# Patient Record
Sex: Male | Born: 1965
Health system: Southern US, Community
[De-identification: ages and names within clinical notes are randomized; demographics above are authoritative.]

## PROBLEM LIST (undated history)

## (undated) HISTORY — PX: TONSILLECTOMY AND ADENOIDECTOMY: SUR1326

## (undated) HISTORY — PX: VASECTOMY: SHX75

## (undated) HISTORY — PX: CHOLECYSTECTOMY: SHX55

## (undated) HISTORY — PX: PLANTAR FASCIA SURGERY: SHX746

---

## 2007-01-21 ENCOUNTER — Ambulatory Visit: Payer: Self-pay | Admitting: Surgery

## 2012-07-11 ENCOUNTER — Ambulatory Visit: Payer: Self-pay | Admitting: Gastroenterology

## 2012-07-12 LAB — PATHOLOGY REPORT

## 2013-07-24 DIAGNOSIS — Z9109 Other allergy status, other than to drugs and biological substances: Secondary | ICD-10-CM | POA: Insufficient documentation

## 2013-07-24 DIAGNOSIS — N529 Male erectile dysfunction, unspecified: Secondary | ICD-10-CM | POA: Insufficient documentation

## 2013-07-24 DIAGNOSIS — R202 Paresthesia of skin: Secondary | ICD-10-CM | POA: Insufficient documentation

## 2013-07-24 DIAGNOSIS — Z720 Tobacco use: Secondary | ICD-10-CM | POA: Insufficient documentation

## 2013-07-24 DIAGNOSIS — R0683 Snoring: Secondary | ICD-10-CM | POA: Insufficient documentation

## 2014-05-28 ENCOUNTER — Ambulatory Visit: Payer: Self-pay | Admitting: Physician Assistant

## 2015-04-07 DIAGNOSIS — J301 Allergic rhinitis due to pollen: Secondary | ICD-10-CM | POA: Diagnosis not present

## 2015-04-07 DIAGNOSIS — F4323 Adjustment disorder with mixed anxiety and depressed mood: Secondary | ICD-10-CM | POA: Diagnosis not present

## 2015-05-18 DIAGNOSIS — F4321 Adjustment disorder with depressed mood: Secondary | ICD-10-CM | POA: Diagnosis not present

## 2015-05-18 DIAGNOSIS — Z125 Encounter for screening for malignant neoplasm of prostate: Secondary | ICD-10-CM | POA: Diagnosis not present

## 2015-05-18 DIAGNOSIS — Z79899 Other long term (current) drug therapy: Secondary | ICD-10-CM | POA: Diagnosis not present

## 2015-05-18 DIAGNOSIS — Z13220 Encounter for screening for lipoid disorders: Secondary | ICD-10-CM | POA: Diagnosis not present

## 2015-07-15 ENCOUNTER — Other Ambulatory Visit: Payer: Self-pay

## 2015-07-15 DIAGNOSIS — K649 Unspecified hemorrhoids: Secondary | ICD-10-CM

## 2015-07-15 NOTE — Progress Notes (Signed)
Proctofoam 1% sent to Pharmacy per Dr. Adonis Huguenin at this time.

## 2015-08-18 DIAGNOSIS — Z125 Encounter for screening for malignant neoplasm of prostate: Secondary | ICD-10-CM | POA: Diagnosis not present

## 2015-08-18 DIAGNOSIS — Z13220 Encounter for screening for lipoid disorders: Secondary | ICD-10-CM | POA: Diagnosis not present

## 2015-08-18 DIAGNOSIS — Z79899 Other long term (current) drug therapy: Secondary | ICD-10-CM | POA: Diagnosis not present

## 2015-08-25 DIAGNOSIS — Z0000 Encounter for general adult medical examination without abnormal findings: Secondary | ICD-10-CM | POA: Diagnosis not present

## 2015-08-25 DIAGNOSIS — Z720 Tobacco use: Secondary | ICD-10-CM | POA: Diagnosis not present

## 2015-08-25 DIAGNOSIS — J301 Allergic rhinitis due to pollen: Secondary | ICD-10-CM | POA: Diagnosis not present

## 2015-08-25 DIAGNOSIS — Z9109 Other allergy status, other than to drugs and biological substances: Secondary | ICD-10-CM | POA: Diagnosis not present

## 2015-08-25 DIAGNOSIS — Z716 Tobacco abuse counseling: Secondary | ICD-10-CM | POA: Diagnosis not present

## 2016-01-04 DIAGNOSIS — L718 Other rosacea: Secondary | ICD-10-CM | POA: Diagnosis not present

## 2016-01-04 DIAGNOSIS — L570 Actinic keratosis: Secondary | ICD-10-CM | POA: Diagnosis not present

## 2016-01-04 DIAGNOSIS — D485 Neoplasm of uncertain behavior of skin: Secondary | ICD-10-CM | POA: Diagnosis not present

## 2016-01-04 DIAGNOSIS — L281 Prurigo nodularis: Secondary | ICD-10-CM | POA: Diagnosis not present

## 2016-01-04 DIAGNOSIS — Z1283 Encounter for screening for malignant neoplasm of skin: Secondary | ICD-10-CM | POA: Diagnosis not present

## 2016-01-12 DIAGNOSIS — D225 Melanocytic nevi of trunk: Secondary | ICD-10-CM | POA: Diagnosis not present

## 2016-01-12 DIAGNOSIS — D485 Neoplasm of uncertain behavior of skin: Secondary | ICD-10-CM | POA: Diagnosis not present

## 2016-02-18 DIAGNOSIS — Z23 Encounter for immunization: Secondary | ICD-10-CM | POA: Diagnosis not present

## 2016-03-05 ENCOUNTER — Encounter: Payer: Self-pay | Admitting: Emergency Medicine

## 2016-03-05 ENCOUNTER — Ambulatory Visit
Admission: EM | Admit: 2016-03-05 | Discharge: 2016-03-05 | Disposition: A | Discharge status: Home / Self Care | Payer: 59 | Attending: Family Medicine | Admitting: Family Medicine

## 2016-03-05 DIAGNOSIS — J209 Acute bronchitis, unspecified: Secondary | ICD-10-CM | POA: Diagnosis not present

## 2016-03-05 MED ORDER — AZITHROMYCIN 250 MG PO TABS: 250.0000 mg | ORAL_TABLET | Freq: Every day | ORAL | 0 refills | Status: AC

## 2016-03-05 NOTE — ED Provider Notes (Signed)
CSN: [id number]     Arrival date & time 03/05/16  [id number] History   First MD Initiated Contact with Patient 03/05/16 [phone]     Chief Complaint  Patient presents with  . Cough  . Otalgia   (Consider location/radiation/quality/duration/timing/severity/associated sxs/prior Treatment) Patient is a 50 year old male, with no medical history, presents today for coughing, nasal congestion, sore throat and right ear pain. Coughing and nasal congestion/nasal drainage have been present for 2 weeks and it's not getting any better or worse. Cough is productive with clear mucus. Mucinex over-the-counter has helped his cough. Sore throat and right ear pain both started last night. Patient denies hearing loss, tinnitus, or ear discharge. Patient also denies fever at home, wheezing, shortness of breath, chest pain, abdominal pain, nausea or vomiting.       History reviewed. No pertinent past medical history. History reviewed. No pertinent surgical history. History reviewed. No pertinent family history. Social History  Substance Use Topics  . Smoking status: Former Research scientist (life sciences)  . Smokeless tobacco: Never Used  . Alcohol use Yes    Review of Systems  Constitutional: Negative for chills, fatigue and fever.  HENT: Positive for congestion, ear pain, rhinorrhea and sore throat. Negative for sinus pain, sinus pressure and sneezing.   Respiratory: Positive for cough. Negative for shortness of breath and wheezing.   Cardiovascular: Negative for chest pain and palpitations.  Gastrointestinal: Negative for abdominal pain, nausea and vomiting.  Musculoskeletal: Negative for myalgias.  Skin: Negative for rash.  Neurological: Negative for dizziness and headaches.    Allergies  Patient has no known allergies.  Home Medications   Prior to Admission medications   Medication Sig Start Date End Date Taking? Authorizing Provider  azithromycin (ZITHROMAX) 250 MG tablet Take 1 tablet (250 mg total) by mouth daily. Take  first 2 tablets together, then 1 every day until finished. 03/05/16 03/10/16  Barry Dienes, NP   Meds Ordered and Administered this Visit  Medications - No data to display  BP 134/88 (BP Location: Right Arm)   Pulse 76   Temp 97.8 F (36.6 C) (Oral)   Resp 16   Ht 6\' 2"  (1.88 m)   Wt 215 lb (97.5 kg)   SpO2 98%   BMI 27.60 kg/m  No data found.   Physical Exam  Constitutional: He is oriented to person, place, and time. He appears well-developed and well-nourished.  HENT:  Head: Normocephalic and atraumatic.  Right Ear: External ear normal.  Left Ear: External ear normal.  Nose: Nose normal.  Mouth/Throat: Oropharynx is clear and moist. No oropharyngeal exudate.  TMs pearly gray bilaterally.No sinus pain on percussion.  Eyes: Conjunctivae and EOM are normal. Pupils are equal, round, and reactive to light.  Neck: Normal range of motion. Neck supple.  Cardiovascular: Normal rate, regular rhythm and normal heart sounds.   Pulmonary/Chest: Effort normal and breath sounds normal. No respiratory distress. He has no wheezes.  Abdominal: Soft. Bowel sounds are normal. He exhibits no distension. There is no tenderness.  Musculoskeletal: Normal range of motion.  Lymphadenopathy:    He has no cervical adenopathy.  Neurological: He is alert and oriented to person, place, and time.  Skin: Skin is warm and dry.  Psychiatric: He has a normal mood and affect.  Nursing note and vitals reviewed.   Urgent Care Course   Clinical Course     Procedures (including critical care time)  Labs Review Labs Reviewed - No data to display  Imaging Review No results found.  MDM   1. Acute bronchitis, unspecified organism    Symptoms are most likely viral. Physical examination unremarkable. Patient is otherwise healthy with no medical history. Informed to treat symptomatically for now. May continue with Mucinex, Delsym or Robitussin OTC. May do salt water gargle, honey or throat lozenges OTC for  sore throat.   May start z-pack if your cough continues to not improve by next week.   Informed to follow up with PCP if no improvement is noted by next week despite treatment today.      Barry Dienes, NP 03/05/16 [phone]

## 2016-03-05 NOTE — ED Triage Notes (Signed)
Patient c/o cough and chest congestion for 2 weeks.  Patient c/o sore throat that started yesterday with right ear pain that started last night. Patient c/o sinus and nasal congestion. Patient denies fevers.

## 2017-06-04 ENCOUNTER — Encounter: Payer: Self-pay | Admitting: Gastroenterology

## 2017-11-26 ENCOUNTER — Other Ambulatory Visit: Payer: Self-pay

## 2017-11-26 DIAGNOSIS — Z86010 Personal history of colonic polyps: Secondary | ICD-10-CM

## 2018-01-21 ENCOUNTER — Encounter: Payer: Self-pay | Admitting: Urology

## 2018-01-21 ENCOUNTER — Other Ambulatory Visit: Payer: Self-pay

## 2018-01-21 ENCOUNTER — Ambulatory Visit: Payer: BC Managed Care – PPO | Admitting: Urology

## 2018-01-21 VITALS — BP 157/97 | HR 77 | Wt 215.0 lb

## 2018-01-21 DIAGNOSIS — N5201 Erectile dysfunction due to arterial insufficiency: Secondary | ICD-10-CM

## 2018-01-21 NOTE — Patient Instructions (Signed)
Please call [phone] to speak with a customer service representative to get access back to [facility].

## 2018-01-21 NOTE — Progress Notes (Signed)
01/21/2018 9:22 AM   Katha Hamming 01-19-1966 [id number]  Referring provider: Idelle Crouch, MD [place] [facility] [place], [place] [address]  Chief Complaint  Patient presents with  . Erectile Dysfunction    HPI: 52 year old male seen in consultation at the request of Dr. Doy Hutching for evaluation of erectile dysfunction.  He presents with a 2-3-year history of difficulty achieving and maintaining an erection.  He was started on PDE 5 inhibitors and has used both tadalafil and sildenafil.  He is currently using sildenafil at a dose of 60-80 mg and is able to achieve an erection firm enough for penetration however will lose the erection prior to orgasm which she estimates around 5 minutes.  He denies pain or curvature with erections.  He has no bothersome lower urinary tract symptoms.  His only organic risk factor is a previous tobacco history.  He smoked intermittently for 26 years at an average of 1 pack/day and quit tobacco use 4 years ago.  He was seen at [facility] in [facility] and was told he had low testosterone.  Dr. Doy Hutching checked his testosterone recently and it was normal at 565.  A PSA in August 2019 was 0.88.  He was also given an intracavernosal injection and has elected to continue sildenafil.   PMH: History reviewed. No pertinent past medical history.  Surgical History: Past Surgical History:  Procedure Laterality Date  . CHOLECYSTECTOMY    . PLANTAR FASCIA SURGERY    . TONSILLECTOMY AND ADENOIDECTOMY    . VASECTOMY      Home Medications:  Allergies as of 01/21/2018   No Known Allergies     Medication List        Accurate as of 01/21/18  9:22 AM. Always use your most recent med list.          sildenafil 20 MG tablet Commonly known as:  REVATIO as needed.       Allergies: No Known Allergies  Family History: Family History  Problem Relation Age of Onset  . Prostate cancer Father   . Colon cancer Mother      Social History:  reports that he quit smoking about 3 years ago. His smoking use included cigarettes. He quit after 35.00 years of use. He has never used smokeless tobacco. He reports that he drinks alcohol. He reports that he does not use drugs.  ROS: UROLOGY Frequent Urination?: Yes Hard to postpone urination?: No Burning/pain with urination?: No Get up at night to urinate?: No Leakage of urine?: No Urine stream starts and stops?: Yes Trouble starting stream?: No Do you have to strain to urinate?: No Blood in urine?: No Urinary tract infection?: No Sexually transmitted disease?: No Injury to kidneys or bladder?: No Painful intercourse?: No Weak stream?: No Erection problems?: Yes Penile pain?: No  Gastrointestinal Nausea?: No Vomiting?: No Indigestion/heartburn?: No Diarrhea?: No Constipation?: No  Constitutional Fever: No Night sweats?: No Weight loss?: No Fatigue?: No  Skin Skin rash/lesions?: No Itching?: No  Eyes Blurred vision?: No Double vision?: No  Ears/Nose/Throat Sore throat?: No Sinus problems?: No  Hematologic/Lymphatic Swollen glands?: No Easy bruising?: No  Cardiovascular Leg swelling?: No Chest pain?: No  Respiratory Cough?: No Shortness of breath?: No  Endocrine Excessive thirst?: No  Musculoskeletal Back pain?: No Joint pain?: No  Neurological Headaches?: No Dizziness?: No  Psychologic Depression?: No Anxiety?: No  Physical Exam: BP (!) 157/97   Pulse 77   Wt 215 lb (97.5 kg)  BMI 27.60 kg/m   Constitutional:  Alert and oriented, No acute distress. HEENT: [place] AT, moist mucus membranes.  Trachea midline, no masses. Cardiovascular: No clubbing, cyanosis, or edema. Respiratory: Normal respiratory effort, no increased work of breathing. GI: Abdomen is soft, nontender, nondistended, no abdominal masses GU: No CVA tenderness.  Penis circumcised without lesions.  Testes descended bilaterally without masses or  tenderness.  Post vasectomy epididymal prominence bilaterally. Lymph: No cervical or inguinal lymphadenopathy. Skin: No rashes, bruises or suspicious lesions. Neurologic: Grossly intact, no focal deficits, moving all 4 extremities. Psychiatric: Normal mood and affect.   Assessment & Plan:   52 year old male with erectile dysfunction.  He currently has erections firm enough for penetration with a PDE 5 inhibitor however has difficulty maintaining an erection.  He was questioning if his problem was psychological.  He has a significant tobacco history and this is most likely organic.  He was informed that ED treatments are the same no matter what the causes.  I did discuss the availability of penile Doppler studies if he really wanted to pursue the etiology.  He has elected to continue the sildenafil.  He may increase to a max of 100 mg.  I also discussed the use of a venous compression band which should help him to be able to maintain the erection.  If he desires to start intracavernosal injections he also was informed this would be available in our office.   Abbie Sons, [place] [address] [place],  [address] [phone]

## 2018-02-15 ENCOUNTER — Other Ambulatory Visit: Payer: Self-pay

## 2018-02-15 ENCOUNTER — Ambulatory Visit
Admission: EM | Admit: 2018-02-15 | Discharge: 2018-02-15 | Disposition: A | Discharge status: Home / Self Care | Payer: BC Managed Care – PPO | Attending: Family Medicine | Admitting: Family Medicine

## 2018-02-15 ENCOUNTER — Encounter: Payer: Self-pay | Admitting: Emergency Medicine

## 2018-02-15 DIAGNOSIS — J111 Influenza due to unidentified influenza virus with other respiratory manifestations: Secondary | ICD-10-CM | POA: Diagnosis not present

## 2018-02-15 LAB — RAPID INFLUENZA A&B ANTIGENS
Influenza A (ARMC): NEGATIVE
Influenza B (ARMC): NEGATIVE

## 2018-02-15 MED ORDER — OSELTAMIVIR PHOSPHATE 75 MG PO CAPS: 75.0000 mg | ORAL_CAPSULE | Freq: Two times a day (BID) | ORAL | 0 refills | Status: DC

## 2018-02-15 MED ORDER — ONDANSETRON 4 MG PO TBDP: 4.0000 mg | ORAL_TABLET | Freq: Three times a day (TID) | ORAL | 0 refills | Status: AC | PRN

## 2018-02-15 NOTE — Discharge Instructions (Signed)
Rest. Fluids.  Medications as prescribed.  Tylenol and ibuprofen for pain.  Take care  Dr. Lacinda Axon

## 2018-02-15 NOTE — ED Triage Notes (Signed)
Pt c/o vomiting, diarrhea, fever (102.0), weakness, body aches and chills. Started yesterday. He was exposed to the flu last week.

## 2018-02-15 NOTE — ED Provider Notes (Signed)
[facility]    CSN: [id number] Arrival date & time: 02/15/18  0910  History   Chief Complaint Chief Complaint  Patient presents with  . Emesis  . Diarrhea  . Fever   HPI   52 year old male presents with above complaints.  Symptoms started abruptly yesterday.  Began with nausea, vomiting, diarrhea.  Then developed fever, body aches, headache.  He has had a recent exposure to the flu.  His symptoms are severe.  He has taken Motrin this morning with improvement in his fever.  No other medications or interventions tried.  No known exacerbating factors.  No other associated symptoms.  No other complaints.  PMH, Surgical Hx, Family Hx, Social History reviewed and updated as below.  PMH: Patient Active Problem List   Diagnosis Date Noted  . ED (erectile dysfunction) 07/24/2013  . Environmental allergies 07/24/2013  . Paresthesia 07/24/2013  . Snoring 07/24/2013  . Tobacco abuse 07/24/2013   Past Surgical History:  Procedure Laterality Date  . CHOLECYSTECTOMY    . PLANTAR FASCIA SURGERY    . TONSILLECTOMY AND ADENOIDECTOMY    . VASECTOMY      Home Medications    Prior to Admission medications   Medication Sig Start Date End Date Taking? Authorizing Provider  sildenafil (REVATIO) 20 MG tablet as needed. 10/19/16  Yes [provider]  ondansetron (ZOFRAN-ODT) 4 MG disintegrating tablet Take 1 tablet (4 mg total) by mouth every 8 (eight) hours as needed for nausea or vomiting. 02/15/18   Coral Spikes, DO  oseltamivir (TAMIFLU) 75 MG capsule Take 1 capsule (75 mg total) by mouth every 12 (twelve) hours. 02/15/18   Coral Spikes, DO    Family History Family History  Problem Relation Age of Onset  . Prostate cancer Father   . Colon cancer Mother     Social History Social History   Tobacco Use  . Smoking status: Former Smoker    Years: 35.00    Types: Cigarettes    Last attempt to quit: 04/27/2014    Years since quitting: 3.8  . Smokeless tobacco:  Never Used  Substance Use Topics  . Alcohol use: Yes  . Drug use: No     Allergies   Patient has no known allergies.   Review of Systems Review of Systems  Constitutional: Positive for fever.  Gastrointestinal: Positive for diarrhea, nausea and vomiting.  Musculoskeletal:       Body aches.  Neurological: Positive for headaches.   Physical Exam Triage Vital Signs ED Triage Vitals  Enc Vitals Group     BP 02/15/18 0932 105/71     Pulse Rate 02/15/18 0932 (!) 105     Resp 02/15/18 0932 18     Temp 02/15/18 0932 98.1 F (36.7 C)     Temp Source 02/15/18 0932 Oral     SpO2 02/15/18 0932 97 %     Weight 02/15/18 0928 212 lb (96.2 kg)     Height 02/15/18 0928 6\' 2"  (1.88 m)     Head Circumference --      Peak Flow --      Pain Score 02/15/18 0928 7     Pain Loc --      Pain Edu? --      Excl. in [place]? --    Updated Vital Signs BP 105/71 (BP Location: Left Arm)   Pulse (!) 105   Temp 98.1 F (36.7 C) (Oral)   Resp 18   Ht 6\' 2"  (1.88 m)  Wt 96.2 kg   SpO2 97%   BMI 27.22 kg/m   Visual Acuity Right Eye Distance:   Left Eye Distance:   Bilateral Distance:    Right Eye Near:   Left Eye Near:    Bilateral Near:     Physical Exam Vitals signs and nursing note reviewed.  Constitutional:      Appearance: He is ill-appearing.  HENT:     Head: Normocephalic and atraumatic.     Right Ear: Tympanic membrane normal.     Left Ear: Tympanic membrane normal.     Mouth/Throat:     Pharynx: Oropharynx is clear.  Eyes:     General:        Right eye: No discharge.        Left eye: No discharge.     Conjunctiva/sclera: Conjunctivae normal.  Cardiovascular:     Rate and Rhythm: Regular rhythm. Tachycardia present.  Pulmonary:     Effort: Pulmonary effort is normal.     Breath sounds: No wheezing, rhonchi or rales.  Abdominal:     General: Abdomen is flat. There is no distension.     Palpations: Abdomen is soft.     Tenderness: There is no abdominal tenderness.    Neurological:     Mental Status: He is alert.  Psychiatric:        Mood and Affect: Mood normal.        Behavior: Behavior normal.    UC Treatments / Results  Labs (all labs ordered are listed, but only abnormal results are displayed) Labs Reviewed  RAPID INFLUENZA A&B ANTIGENS ([place])    EKG None  Radiology No results found.  Procedures Procedures (including critical care time)  Medications Ordered in UC Medications - No data to display  Initial Impression / Assessment and Plan / UC Course  I have reviewed the triage vital signs and the nursing notes.  Pertinent labs & imaging results that were available during my care of the patient were reviewed by me and considered in my medical decision making (see chart for details).    52 year old male presents with viral illness.  Possible influenza despite negative testing.  Placing on Tamiflu.  Zofran as needed.  Supportive care.  Work note given.  Final Clinical Impressions(s) / UC Diagnoses   Final diagnoses:  Influenza-like syndrome     Discharge Instructions     Rest. Fluids.  Medications as prescribed.  Tylenol and ibuprofen for pain.  Take care  Dr. Lacinda Axon    ED Prescriptions    Medication Sig Dispense Auth. Provider   ondansetron (ZOFRAN-ODT) 4 MG disintegrating tablet Take 1 tablet (4 mg total) by mouth every 8 (eight) hours as needed for nausea or vomiting. 20 tablet Molli Gethers G, DO   oseltamivir (TAMIFLU) 75 MG capsule Take 1 capsule (75 mg total) by mouth every 12 (twelve) hours. 10 capsule Coral Spikes, DO     Controlled Substance Prescriptions [place] Controlled Substance Registry consulted? Not Applicable   Coral Spikes, DO 02/15/18 1008

## 2018-03-08 ENCOUNTER — Ambulatory Visit (INDEPENDENT_AMBULATORY_CARE_PROVIDER_SITE_OTHER): Payer: BC Managed Care – PPO | Admitting: Urology

## 2018-03-08 ENCOUNTER — Telehealth: Payer: Self-pay | Admitting: Urology

## 2018-03-08 ENCOUNTER — Encounter: Payer: Self-pay | Admitting: Urology

## 2018-03-08 VITALS — BP 147/83 | HR 96 | Ht 74.0 in | Wt 212.0 lb

## 2018-03-08 DIAGNOSIS — N5201 Erectile dysfunction due to arterial insufficiency: Secondary | ICD-10-CM

## 2018-03-08 NOTE — Telephone Encounter (Signed)
Pt called to let you know erection lasted about 2 hours.  He would like for you to give him a call.

## 2018-03-08 NOTE — Progress Notes (Signed)
03/08/2018  11:30 AM   Brent Cochran Dec 06, 1965 [id number]  Referring provider: No referring provider defined for this encounter.  Chief Complaint  Patient presents with  . Erectile Dysfunction    Urologic History 1. Erectile dysfunction  - Reported erections firm enough for penetration on PDE5 inhinitor however reported difficulty maintaining an erection  - Most likely organic due to significant tobacco history   HPI: Brent Cochran is a 53 y.o. male that presents today for instructions on IM injections (Edex) and to demonstrate his proficiency with the injections.  PMH: No past medical history on file.  Surgical History: Past Surgical History:  Procedure Laterality Date  . CHOLECYSTECTOMY    . PLANTAR FASCIA SURGERY    . TONSILLECTOMY AND ADENOIDECTOMY    . VASECTOMY     Home Medications:  Allergies as of 03/08/2018   No Known Allergies     Medication List       Accurate as of March 08, 2018 11:30 AM. Always use your most recent med list.        ondansetron 4 MG disintegrating tablet Commonly known as:  ZOFRAN-ODT Take 1 tablet (4 mg total) by mouth every 8 (eight) hours as needed for nausea or vomiting.   sildenafil 20 MG tablet Commonly known as:  REVATIO as needed.      Allergies: No Known Allergies  Family History: Family History  Problem Relation Age of Onset  . Prostate cancer Father   . Colon cancer Mother    Social History:  reports that he quit smoking about 3 years ago. His smoking use included cigarettes. He quit after 35.00 years of use. He has never used smokeless tobacco. He reports current alcohol use. He reports that he does not use drugs.  ROS: UROLOGY Frequent Urination?: No Hard to postpone urination?: No Burning/pain with urination?: No Get up at night to urinate?: No Leakage of urine?: No Urine stream starts and stops?: No Trouble starting stream?: No Do you have to strain to urinate?: No Blood in urine?: No Urinary  tract infection?: No Sexually transmitted disease?: No Injury to kidneys or bladder?: No Painful intercourse?: No Weak stream?: No Erection problems?: Yes Penile pain?: No  Gastrointestinal Nausea?: No Vomiting?: No Indigestion/heartburn?: Yes Diarrhea?: No Constipation?: No  Constitutional Fever: No Night sweats?: No Weight loss?: No Fatigue?: No  Skin Skin rash/lesions?: No Itching?: No  Eyes Blurred vision?: No Double vision?: No  Ears/Nose/Throat Sore throat?: No Sinus problems?: No  Hematologic/Lymphatic Swollen glands?: No Easy bruising?: No  Cardiovascular Leg swelling?: No Chest pain?: No  Respiratory Cough?: No Shortness of breath?: No  Endocrine Excessive thirst?: No  Musculoskeletal Back pain?: No Joint pain?: No  Neurological Headaches?: No Dizziness?: No  Psychologic Depression?: No Anxiety?: No  Physical Exam: BP (!) 147/83 (BP Location: Left Arm, Patient Position: Sitting, Cuff Size: Large)   Pulse 96   Ht 6\' 2"  (1.88 m)   Wt 212 lb (96.2 kg)   BMI 27.22 kg/m   Constitutional:  Alert and oriented, No acute distress. Respiratory: Normal respiratory effort, no increased work of breathing. GU: No CVA tenderness Skin: No rashes, bruises or suspicious lesions. Neurologic: Grossly intact, no focal deficits, moving all 4 extremities. Psychiatric: Normal mood and affect.  Intracavernosal injection Patient is present today for an cavernosal injection for treatment of erectile dysfunction Drug: Edex Dose:5 mcg Location: Right corpus cavernosum Lot: [id number] Exp: 09/2019 Patient tolerated well, no complications were noted   Preformed by: Abbie Sons,  MD   Additional notes/ Follow up: Gave patient an anatomy overview.  He was observed to successfully inject 5 mcg of prostaglandin into the right corpus cavernosum.  After 15 minutes he had a rigid erection which he states would be sufficient for successful intercourse.  He  was directed to call if his erection persist over 3 hours.  He will also call back regarding the duration of this erection at this dose.    He stated he had good comfort level with the process of the injections.    Assessment & Plan:   1. Erectile dysfunction  - Tolerate injection procedure well  - Discussed injection options and associated costs as well as pros and cons  - Will send an Rx for compounded prostaglandin to [facility] patient of the condition of priapism, painful erection lasting for more than four hours, and to contact the office immediately or seek treatment in the ED   Return in about 4 months (around 07/07/2018) for symptom recheck and follow up.  Abbie Sons, MD  [facility] [address], [place], [place] [address] [phone]   I, Stephania Fragmin , am acting as a scribe for [employer], MD  I, Abbie Sons, MD, have reviewed all documentation for this visit. The documentation on 03/08/18 for the exam, diagnosis, procedures, and orders are all accurate and complete.

## 2018-03-08 NOTE — Telephone Encounter (Signed)
Spoke with patient.  Will send in Rx prostaglandin 10 mcg/mL to [facility].  Inject 0.2-0.3 cc

## 2018-03-11 ENCOUNTER — Ambulatory Visit: Admit: 2018-03-11 | Payer: Self-pay | Admitting: Gastroenterology

## 2018-03-11 SURGERY — COLONOSCOPY WITH PROPOFOL
Anesthesia: Choice

## 2018-03-18 ENCOUNTER — Telehealth: Payer: Self-pay | Admitting: Urology

## 2018-03-18 NOTE — Telephone Encounter (Signed)
Pt states that he wants to talk to Dr. Bernardo Heater. He had a shot last week "and it didn't take"

## 2018-03-18 NOTE — Telephone Encounter (Signed)
Patient notified and will try again at home. If he continues to have difficulty he will call and schedule an appointment.

## 2018-03-18 NOTE — Telephone Encounter (Signed)
He had an excellent sustained erection on his in office injection.  If he did not get an erection with the last injection it most likely was not properly administered.  If he needs a review of the injection technique would schedule a follow-up with Larene Beach.

## 2018-05-03 ENCOUNTER — Encounter: Payer: Self-pay | Admitting: Emergency Medicine

## 2018-05-03 ENCOUNTER — Other Ambulatory Visit: Payer: Self-pay

## 2018-05-03 ENCOUNTER — Ambulatory Visit
Admission: EM | Admit: 2018-05-03 | Discharge: 2018-05-03 | Disposition: A | Discharge status: Home / Self Care | Payer: BC Managed Care – PPO | Attending: Family Medicine | Admitting: Family Medicine

## 2018-05-03 DIAGNOSIS — B9789 Other viral agents as the cause of diseases classified elsewhere: Secondary | ICD-10-CM

## 2018-05-03 DIAGNOSIS — J0100 Acute maxillary sinusitis, unspecified: Secondary | ICD-10-CM

## 2018-05-03 MED ORDER — PREDNISONE 20 MG PO TABS: ORAL_TABLET | ORAL | 0 refills | Status: AC

## 2018-05-03 MED ORDER — AMOXICILLIN-POT CLAVULANATE 875-125 MG PO TABS: 1.0000 | ORAL_TABLET | Freq: Two times a day (BID) | ORAL | 0 refills | Status: DC

## 2018-05-03 NOTE — ED Provider Notes (Signed)
[facility]    CSN: [id number] Arrival date & time: 05/03/18  0818     History   Chief Complaint Chief Complaint  Patient presents with  . Cough    HPI ADRION MENZ is a 53 y.o. male.   The history is provided by the patient.  Cough  Associated symptoms: no wheezing   URI  Presenting symptoms: congestion, cough and fatigue   Severity:  Moderate Onset quality:  Sudden Duration:  6 weeks Timing:  Constant Progression:  Unchanged Chronicity:  New Relieved by:  Nothing Ineffective treatments:  OTC medications Associated symptoms: no wheezing   Risk factors: sick contacts     History reviewed. No pertinent past medical history.  Patient Active Problem List   Diagnosis Date Noted  . ED (erectile dysfunction) 07/24/2013  . Environmental allergies 07/24/2013  . Paresthesia 07/24/2013  . Snoring 07/24/2013  . Tobacco abuse 07/24/2013    Past Surgical History:  Procedure Laterality Date  . CHOLECYSTECTOMY    . PLANTAR FASCIA SURGERY    . TONSILLECTOMY AND ADENOIDECTOMY    . VASECTOMY         Home Medications    Prior to Admission medications   Medication Sig Start Date End Date Taking? Authorizing Provider  amoxicillin-clavulanate (AUGMENTIN) 875-125 MG tablet Take 1 tablet by mouth 2 (two) times daily. 05/03/18   Norval Gable, MD  ondansetron (ZOFRAN-ODT) 4 MG disintegrating tablet Take 1 tablet (4 mg total) by mouth every 8 (eight) hours as needed for nausea or vomiting. 02/15/18   Coral Spikes, DO  predniSONE (DELTASONE) 20 MG tablet 2 tabs po qd for 7 days 05/03/18   Norval Gable, MD  sildenafil (REVATIO) 20 MG tablet as needed. 10/19/16   [provider]    Family History Family History  Problem Relation Age of Onset  . Prostate cancer Father   . Colon cancer Mother     Social History Social History   Tobacco Use  . Smoking status: Former Smoker    Years: 35.00    Types: Cigarettes    Last attempt to quit:  04/27/2014    Years since quitting: 4.0  . Smokeless tobacco: Never Used  Substance Use Topics  . Alcohol use: Yes  . Drug use: No     Allergies   Patient has no known allergies.   Review of Systems Review of Systems  Constitutional: Positive for fatigue.  HENT: Positive for congestion.   Respiratory: Positive for cough. Negative for wheezing.      Physical Exam Triage Vital Signs ED Triage Vitals  Enc Vitals Group     BP 05/03/18 0827 (!) 130/91     Pulse Rate 05/03/18 0827 91     Resp 05/03/18 0827 16     Temp 05/03/18 0827 98 F (36.7 C)     Temp Source 05/03/18 0827 Oral     SpO2 05/03/18 0827 97 %     Weight 05/03/18 0825 210 lb (95.3 kg)     Height 05/03/18 0825 6\' 2"  (1.88 m)     Head Circumference --      Peak Flow --      Pain Score 05/03/18 0824 2     Pain Loc --      Pain Edu? --      Excl. in [place]? --    No data found.  Updated Vital Signs BP (!) 130/91 (BP Location: Left Arm)   Pulse 91   Temp 98 F (36.7  C) (Oral)   Resp 16   Ht 6\' 2"  (1.88 m)   Wt 95.3 kg   SpO2 97%   BMI 26.96 kg/m   Visual Acuity Right Eye Distance:   Left Eye Distance:   Bilateral Distance:    Right Eye Near:   Left Eye Near:    Bilateral Near:     Physical Exam Vitals signs and nursing note reviewed.  Constitutional:      General: He is not in acute distress.    Appearance: He is well-developed. He is not diaphoretic.  HENT:     Head: Normocephalic and atraumatic.     Nose: Congestion present.     Right Sinus: Maxillary sinus tenderness and frontal sinus tenderness present.     Left Sinus: Maxillary sinus tenderness and frontal sinus tenderness present.     Mouth/Throat:     Pharynx: Uvula midline. No oropharyngeal exudate.     Tonsils: No tonsillar abscesses.  Neck:     Musculoskeletal: Normal range of motion and neck supple.     Thyroid: No thyromegaly.     Trachea: No tracheal deviation.  Cardiovascular:     Rate and Rhythm: Normal rate and regular  rhythm.     Heart sounds: Normal heart sounds.  Pulmonary:     Effort: Pulmonary effort is normal. No respiratory distress.     Breath sounds: Normal breath sounds. No stridor. No wheezing, rhonchi or rales.  Chest:     Chest wall: No tenderness.  Lymphadenopathy:     Cervical: No cervical adenopathy.  Skin:    General: Skin is warm and dry.     Findings: No rash.  Neurological:     Mental Status: He is alert.      UC Treatments / Results  Labs (all labs ordered are listed, but only abnormal results are displayed) Labs Reviewed - No data to display  EKG None  Radiology No results found.  Procedures Procedures (including critical care time)  Medications Ordered in UC Medications - No data to display  Initial Impression / Assessment and Plan / UC Course  I have reviewed the triage vital signs and the nursing notes.  Pertinent labs & imaging results that were available during my care of the patient were reviewed by me and considered in my medical decision making (see chart for details).      Final Clinical Impressions(s) / UC Diagnoses   Final diagnoses:  Acute maxillary sinusitis, recurrence not specified    ED Prescriptions    Medication Sig Dispense Auth. Provider   amoxicillin-clavulanate (AUGMENTIN) 875-125 MG tablet Take 1 tablet by mouth 2 (two) times daily. 20 tablet Norval Gable, MD   predniSONE (DELTASONE) 20 MG tablet 2 tabs po qd for 7 days 14 tablet Zhi Geier, MD      1. diagnosis reviewed with patient 2. rx as per orders above; reviewed possible side effects, interactions, risks and benefits  3. Recommend supportive treatment with otc steroid nasal spray  4. Follow-up prn if symptoms worsen or don't improve   Controlled Substance Prescriptions [place] Controlled Substance Registry consulted? Not Applicable   Norval Gable, MD 05/03/18 [phone]

## 2018-05-03 NOTE — ED Triage Notes (Signed)
Patient c/o cough and chest congestion off and on for the past 6 weeks.  Patient denies fevers.

## 2018-06-27 ENCOUNTER — Telehealth: Payer: Self-pay | Admitting: Urology

## 2018-06-27 MED ORDER — NONFORMULARY OR COMPOUNDED ITEM: 6 refills | Status: AC

## 2018-06-27 NOTE — Telephone Encounter (Signed)
Would recommend rescheduling appointment July/August.    Okay to refill Trimix until follow-up appointment.

## 2018-06-27 NOTE — Telephone Encounter (Signed)
Script printed to be signed and faxed please schedule apt

## 2018-06-27 NOTE — Telephone Encounter (Signed)
Called patient to schedule a virtual app and he declined to schedule said he was doing good and the only thing he needed at the moment was a refill on his trimix injections called in to [facility]. I told him I would send you a message and see if this could be done.  Please advise the clinical staff as I am not able to send the RX   Thanks, Sharyn Lull

## 2018-06-28 NOTE — Telephone Encounter (Signed)
Left message for patient that his RX was sent and to call the office to schedule an app   Sharyn Lull

## 2018-07-08 ENCOUNTER — Ambulatory Visit: Payer: BC Managed Care – PPO | Admitting: Urology

## 2018-10-25 ENCOUNTER — Other Ambulatory Visit: Payer: Self-pay | Admitting: Internal Medicine

## 2018-10-25 DIAGNOSIS — R911 Solitary pulmonary nodule: Secondary | ICD-10-CM

## 2018-11-05 ENCOUNTER — Other Ambulatory Visit: Payer: Self-pay

## 2018-11-05 ENCOUNTER — Ambulatory Visit: Payer: BC Managed Care – PPO

## 2018-11-05 ENCOUNTER — Ambulatory Visit
Admission: RE | Admit: 2018-11-05 | Discharge: 2018-11-05 | Disposition: A | Discharge status: Home / Self Care | Payer: BC Managed Care – PPO | Source: Ambulatory Visit | Attending: Internal Medicine | Admitting: Internal Medicine

## 2018-11-05 DIAGNOSIS — R911 Solitary pulmonary nodule: Secondary | ICD-10-CM | POA: Diagnosis not present

## 2019-05-08 ENCOUNTER — Other Ambulatory Visit: Payer: Self-pay | Admitting: Internal Medicine

## 2019-05-08 DIAGNOSIS — R911 Solitary pulmonary nodule: Secondary | ICD-10-CM

## 2019-05-10 ENCOUNTER — Other Ambulatory Visit: Payer: Self-pay

## 2019-05-10 ENCOUNTER — Ambulatory Visit: Payer: BC Managed Care – PPO

## 2019-05-11 ENCOUNTER — Ambulatory Visit: Payer: BC Managed Care – PPO | Attending: Internal Medicine

## 2019-05-11 DIAGNOSIS — Z23 Encounter for immunization: Secondary | ICD-10-CM | POA: Insufficient documentation

## 2019-05-11 NOTE — Progress Notes (Signed)
   Covid-19 Vaccination Clinic  Name:  Brent Cochran    MRN: [id number] DOB: 11/13/1965  05/11/2019  [name] was observed post Covid-19 immunization for 15 minutes without incident. He was provided with Vaccine Information Sheet and instruction to access the V-Safe system.   [name] was instructed to call 911 with any severe reactions post vaccine: Marland Kitchen Difficulty breathing  . Swelling of face and throat  . A fast heartbeat  . A bad rash all over body  . Dizziness and weakness   Immunizations Administered    Name Date Dose VIS Date Route   Pfizer COVID-19 Vaccine 05/11/2019  8:18 AM 0.3 mL 02/14/2019 Intramuscular   Manufacturer: [place]   Lot: [id number]   [place]: [id number]

## 2019-05-20 ENCOUNTER — Ambulatory Visit
Admission: RE | Admit: 2019-05-20 | Discharge: 2019-05-20 | Disposition: A | Discharge status: Home / Self Care | Payer: BC Managed Care – PPO | Source: Ambulatory Visit | Attending: Internal Medicine | Admitting: Internal Medicine

## 2019-05-20 ENCOUNTER — Other Ambulatory Visit: Payer: Self-pay

## 2019-05-20 DIAGNOSIS — R911 Solitary pulmonary nodule: Secondary | ICD-10-CM | POA: Diagnosis not present

## 2019-05-22 ENCOUNTER — Other Ambulatory Visit: Payer: Self-pay | Admitting: Internal Medicine

## 2019-06-03 ENCOUNTER — Ambulatory Visit: Payer: BC Managed Care – PPO | Attending: Internal Medicine

## 2019-06-03 DIAGNOSIS — Z23 Encounter for immunization: Secondary | ICD-10-CM

## 2019-06-03 NOTE — Progress Notes (Signed)
   Covid-19 Vaccination Clinic  Name:  Brent Cochran    MRN: [id number] DOB: Jan 10, 1966  06/03/2019  Brent Cochran was observed post Covid-19 immunization for 15 minutes without incident. He was provided with Vaccine Information Sheet and instruction to access the V-Safe system.   Brent Cochran was instructed to call 911 with any severe reactions post vaccine: Marland Kitchen Difficulty breathing  . Swelling of face and throat  . A fast heartbeat  . A bad rash all over body  . Dizziness and weakness   Immunizations Administered    Name Date Dose VIS Date Route   Pfizer COVID-19 Vaccine 06/03/2019  8:14 AM 0.3 mL 02/14/2019 Intramuscular   Manufacturer: [place]   Lot: [id number]   [place]: [id number]

## 2020-02-10 ENCOUNTER — Other Ambulatory Visit: Payer: Self-pay | Admitting: Internal Medicine

## 2020-02-10 ENCOUNTER — Other Ambulatory Visit (HOSPITAL_COMMUNITY): Payer: Self-pay | Admitting: Internal Medicine

## 2020-02-10 DIAGNOSIS — R911 Solitary pulmonary nodule: Secondary | ICD-10-CM

## 2020-03-14 ENCOUNTER — Ambulatory Visit
Admission: EM | Admit: 2020-03-14 | Discharge: 2020-03-14 | Disposition: A | Discharge status: Home / Self Care | Payer: BC Managed Care – PPO | Attending: Physician Assistant | Admitting: Physician Assistant

## 2020-03-14 ENCOUNTER — Other Ambulatory Visit: Payer: Self-pay

## 2020-03-14 ENCOUNTER — Encounter: Payer: Self-pay | Admitting: Emergency Medicine

## 2020-03-14 DIAGNOSIS — F17210 Nicotine dependence, cigarettes, uncomplicated: Secondary | ICD-10-CM | POA: Diagnosis not present

## 2020-03-14 DIAGNOSIS — B349 Viral infection, unspecified: Secondary | ICD-10-CM | POA: Diagnosis not present

## 2020-03-14 DIAGNOSIS — R059 Cough, unspecified: Secondary | ICD-10-CM

## 2020-03-14 DIAGNOSIS — R509 Fever, unspecified: Secondary | ICD-10-CM | POA: Diagnosis not present

## 2020-03-14 DIAGNOSIS — U071 COVID-19: Secondary | ICD-10-CM | POA: Insufficient documentation

## 2020-03-14 NOTE — ED Triage Notes (Signed)
Patient c/o cough and fever that started on Tuesday.

## 2020-03-14 NOTE — Discharge Instructions (Signed)

## 2020-03-14 NOTE — ED Provider Notes (Signed)
[facility]    CSN: [id number] Arrival date & time: 03/14/20  1317      History   Chief Complaint Chief Complaint  Patient presents with  . Cough  . Fever    HPI Brent Cochran is a 55 y.o. male presenting for 2 to 3-day history of fever up to 100.5 degrees, cough and nasal congestion.  He denies any fatigue, body aches, chest pain, difficulty breathing, sore throat, or changes in smell and taste.  Denies any known COVID-19 exposure.  He has received COVID-19 vaccines in March 2021 but has not been boosted.  He has been taking over-the-counter decongestants and doing sinus rinses and says that that does improve symptoms.  Patient admits to history of chronic sinus issues and states that his symptoms feel consistent with a sinus problem.  Patient states that his wife supposed to have a breast surgery in 2 days and he wanted to make sure he did not have Covid.  He denies any other complaints or concerns.  HPI  History reviewed. No pertinent past medical history.  Patient Active Problem List   Diagnosis Date Noted  . ED (erectile dysfunction) 07/24/2013  . Environmental allergies 07/24/2013  . Paresthesia 07/24/2013  . Snoring 07/24/2013  . Tobacco abuse 07/24/2013    Past Surgical History:  Procedure Laterality Date  . CHOLECYSTECTOMY    . PLANTAR FASCIA SURGERY    . TONSILLECTOMY AND ADENOIDECTOMY    . VASECTOMY         Home Medications    Prior to Admission medications   Medication Sig Start Date End Date Taking? Authorizing Provider  amoxicillin-clavulanate (AUGMENTIN) 875-125 MG tablet Take 1 tablet by mouth 2 (two) times daily. 05/03/18   Norval Gable, MD  NONFORMULARY OR COMPOUNDED ITEM Trimix (30/1/10)-(Pap/Phent/PGE)  Dosage: Inject 0.5 cc per injection  Prefilled Syringe # Test Dose 72ml vial Vial  50ml   Qty #10  Refills #6  [place] [phone] Fax [phone] 06/27/18   Abbie Sons, MD  ondansetron (ZOFRAN-ODT) 4 MG  disintegrating tablet Take 1 tablet (4 mg total) by mouth every 8 (eight) hours as needed for nausea or vomiting. 02/15/18   Coral Spikes, DO  predniSONE (DELTASONE) 20 MG tablet 2 tabs po qd for 7 days 05/03/18   Norval Gable, MD  sildenafil (REVATIO) 20 MG tablet as needed. 10/19/16   [provider]    Family History Family History  Problem Relation Age of Onset  . Prostate cancer Father   . Colon cancer Mother     Social History Social History   Tobacco Use  . Smoking status: Current Some Day Smoker    Years: 35.00    Types: Cigarettes    Last attempt to quit: 04/27/2014    Years since quitting: 5.8  . Smokeless tobacco: Never Used  Vaping Use  . Vaping Use: Never used  Substance Use Topics  . Alcohol use: Yes  . Drug use: No     Allergies   Patient has no known allergies.   Review of Systems Review of Systems  Constitutional: Positive for fever. Negative for fatigue.  HENT: Positive for congestion and rhinorrhea. Negative for sinus pressure, sinus pain and sore throat.   Respiratory: Positive for cough. Negative for shortness of breath.   Gastrointestinal: Negative for abdominal pain, diarrhea, nausea and vomiting.  Musculoskeletal: Negative for myalgias.  Neurological: Negative for weakness, light-headedness and headaches.  Hematological: Negative for adenopathy.     Physical  Exam Triage Vital Signs ED Triage Vitals  Enc Vitals Group     BP 03/14/20 1400 106/81     Pulse Rate 03/14/20 1400 (!) 108     Resp 03/14/20 1400 16     Temp 03/14/20 1400 98.8 F (37.1 C)     Temp Source 03/14/20 1400 Oral     SpO2 03/14/20 1400 96 %     Weight 03/14/20 1357 215 lb (97.5 kg)     Height 03/14/20 1357 6\' 2"  (1.88 m)     Head Circumference --      Peak Flow --      Pain Score 03/14/20 1357 0     Pain Loc --      Pain Edu? --      Excl. in [place]? --    No data found.  Updated Vital Signs BP 106/81 (BP Location: Left Arm)   Pulse (!) 108   Temp 98.8  F (37.1 C) (Oral)   Resp 16   Ht 6\' 2"  (1.88 m)   Wt 215 lb (97.5 kg)   SpO2 96%   BMI 27.60 kg/m    Physical Exam Vitals and nursing note reviewed.  Constitutional:      General: He is not in acute distress.    Appearance: Normal appearance. He is well-developed and well-nourished. He is not ill-appearing or diaphoretic.  HENT:     Head: Normocephalic and atraumatic.     Left Ear: External ear normal.     Nose: Congestion and rhinorrhea present.     Mouth/Throat:     Mouth: Oropharynx is clear and moist and mucous membranes are normal. Mucous membranes are moist.     Pharynx: Uvula midline.     Tonsils: No tonsillar abscesses.  Eyes:     General: No scleral icterus.       Right eye: No discharge.        Left eye: No discharge.     Extraocular Movements: EOM normal.     Conjunctiva/sclera: Conjunctivae normal.  Neck:     Thyroid: No thyromegaly.     Trachea: No tracheal deviation.  Cardiovascular:     Rate and Rhythm: Regular rhythm. Tachycardia present.     Heart sounds: Normal heart sounds.  Pulmonary:     Effort: Pulmonary effort is normal. No respiratory distress.     Breath sounds: Normal breath sounds. No wheezing, rhonchi or rales.  Musculoskeletal:     Cervical back: Normal range of motion and neck supple.  Lymphadenopathy:     Cervical: No cervical adenopathy.  Skin:    General: Skin is warm and dry.     Findings: No rash.  Neurological:     General: No focal deficit present.     Mental Status: He is alert. Mental status is at baseline.     Motor: No weakness.     Gait: Gait normal.  Psychiatric:        Mood and Affect: Mood normal.        Behavior: Behavior normal.        Thought Content: Thought content normal.      UC Treatments / Results  Labs (all labs ordered are listed, but only abnormal results are displayed) Labs Reviewed  SARS CORONAVIRUS 2 (TAT 6-24 HRS)    EKG   Radiology No results found.  Procedures Procedures (including  critical care time)  Medications Ordered in UC Medications - No data to display  Initial Impression / Assessment and Plan /  UC Course  I have reviewed the triage vital signs and the nursing notes.  Pertinent labs & imaging results that were available during my care of the patient were reviewed by me and considered in my medical decision making (see chart for details).   Suspect viral illness based on symptoms and exam.  Current Covid CDC guidelines, isolation protocol, and ED precautions reviewed with patient.  Advised continuing supportive care with increasing rest and fluids and over-the-counter decongestants as well as sinus rinses.  Patient says that he does not feel that badly.  Advised him to follow-up with our department if any symptoms worsen including fever or if he develops any chest pain or breathing problem.  Patient agreeable.   Final Clinical Impressions(s) / UC Diagnoses   Final diagnoses:  Viral illness  Cough  Fever, unspecified     Discharge Instructions     You have received COVID testing today either for positive exposure, concerning symptoms that could be related to COVID infection, screening purposes, or re-testing after confirmed positive.  Your test obtained today checks for active viral infection in the last 1-2 weeks. If your test is negative now, you can still test positive later. So, if you do develop symptoms you should either get re-tested and/or isolate x 5 days and then strict mask use x 5 days (unvaccinated) or mask use x 10 days (vaccinated). Please follow CDC guidelines.  While Rapid antigen tests come back in 15-20 minutes, send out PCR/molecular test results typically come back within 1-3 days. In the mean time, if you are symptomatic, assume this could be a positive test and treat/monitor yourself as if you do have COVID.   We will call with test results if positive. Please download the MyChart app and set up a profile to access test results.   If  symptomatic, go home and rest. Push fluids. Take Tylenol as needed for discomfort. Gargle warm salt water. Throat lozenges. Take Mucinex DM or Robitussin for cough. Humidifier in bedroom to ease coughing. Warm showers. Also review the COVID handout for more information.  COVID-19 INFECTION: The incubation period of COVID-19 is approximately 14 days after exposure, with most symptoms developing in roughly 4-5 days. Symptoms may range in severity from mild to critically severe. Roughly 80% of those infected will have mild symptoms. People of any age may become infected with COVID-19 and have the ability to transmit the virus. The most common symptoms include: fever, fatigue, cough, body aches, headaches, sore throat, nasal congestion, shortness of breath, nausea, vomiting, diarrhea, changes in smell and/or taste.    COURSE OF ILLNESS Some patients may begin with mild disease which can progress quickly into critical symptoms. If your symptoms are worsening please call ahead to the Emergency Department and proceed there for further treatment. Recovery time appears to be roughly 1-2 weeks for mild symptoms and 3-6 weeks for severe disease.   GO IMMEDIATELY TO ER FOR FEVER YOU ARE UNABLE TO GET DOWN WITH TYLENOL, BREATHING PROBLEMS, CHEST PAIN, FATIGUE, LETHARGY, INABILITY TO EAT OR DRINK, ETC  QUARANTINE AND ISOLATION: To help decrease the spread of COVID-19 please remain isolated if you have COVID infection or are highly suspected to have COVID infection. This means -stay home and isolate to one room in the home if you live with others. Do not share a bed or bathroom with others while ill, sanitize and wipe down all countertops and keep common areas clean and disinfected. Stay home for 5 days. If you have no  symptoms or your symptoms are resolving after 5 days, you can leave your house. Continue to wear a mask around others for 5 additional days. If you have been in close contact (within 6 feet) of someone  diagnosed with COVID 19, you are advised to quarantine in your home for 14 days as symptoms can develop anywhere from 2-14 days after exposure to the virus. If you develop symptoms, you  must isolate.  Most current guidelines for COVID after exposure -unvaccinated: isolate 5 days and strict mask use x 5 days. Test on day 5 is possible -vaccinated: wear mask x 10 days if symptoms do not develop -You do not necessarily need to be tested for COVID if you have + exposure and  develop symptoms. Just isolate at home x10 days from symptom onset During this global pandemic, CDC advises to practice social distancing, try to stay at least 63ft away from others at all times. Wear a face covering. Wash and sanitize your hands regularly and avoid going anywhere that is not necessary.  KEEP IN MIND THAT THE COVID TEST IS NOT 100% ACCURATE AND YOU SHOULD STILL DO EVERYTHING TO PREVENT POTENTIAL SPREAD OF VIRUS TO OTHERS (WEAR MASK, WEAR GLOVES, [place] HANDS AND SANITIZE REGULARLY). IF INITIAL TEST IS NEGATIVE, THIS MAY NOT MEAN YOU ARE DEFINITELY NEGATIVE. MOST ACCURATE TESTING IS DONE 5-7 DAYS AFTER EXPOSURE.   It is not advised by CDC to get re-tested after receiving a positive COVID test since you can still test positive for weeks to months after you have already cleared the virus.   *If you have not been vaccinated for COVID, I strongly suggest you consider getting vaccinated as long as there are no contraindications.      ED Prescriptions    None     PDMP not reviewed this encounter.   Danton Clap, PA-C 03/14/20 [phone]

## 2020-03-15 LAB — SARS CORONAVIRUS 2 (TAT 6-24 HRS): SARS Coronavirus 2: POSITIVE — AB

## 2020-04-16 ENCOUNTER — Ambulatory Visit: Payer: BC Managed Care – PPO | Attending: Internal Medicine

## 2020-04-16 ENCOUNTER — Other Ambulatory Visit (HOSPITAL_COMMUNITY): Payer: Self-pay | Admitting: Internal Medicine

## 2020-04-16 DIAGNOSIS — Z23 Encounter for immunization: Secondary | ICD-10-CM

## 2020-04-16 NOTE — Progress Notes (Signed)
   Covid-19 Vaccination Clinic  Name:  TORIN MODICA    MRN: [id number] DOB: [birth year]  04/16/2020  Mr. Cork was observed post Covid-19 immunization for 15 minutes without incident. He was provided with Vaccine Information Sheet and instruction to access the V-Safe system.   Mr. Gibbs was instructed to call 911 with any severe reactions post vaccine: Marland Kitchen Difficulty breathing  . Swelling of face and throat  . A fast heartbeat  . A bad rash all over body  . Dizziness and weakness   Immunizations Administered    Name Date Dose VIS Date Route   PFIZER Comrnaty(Gray TOP) Covid-19 Vaccine 04/16/2020  2:30 PM 0.3 mL 02/12/2020 Intramuscular   Manufacturer: [place]   Lot: [id number]   [facility]: [phone]

## 2020-09-24 IMAGING — CT CT CHEST W/O CM
2 of 4 series · 15 of 36 positions shown, 18 images · non-contrast
Comparison: None.

CLINICAL DATA: 53-year-old male presenting for follow-up of
pulmonary nodule. Patient is asymptomatic. History of smoking.

EXAM:
CT CHEST WITHOUT CONTRAST
TECHNIQUE: Multidetector CT imaging of the chest was performed following the
standard protocol without IV contrast.

[Series 2: axial chest · axial · 0.76mm/px · z∈[-1188,-902]mm · 12 of 170 slices shown, 15 images]
[im 14/170  mediastinal]
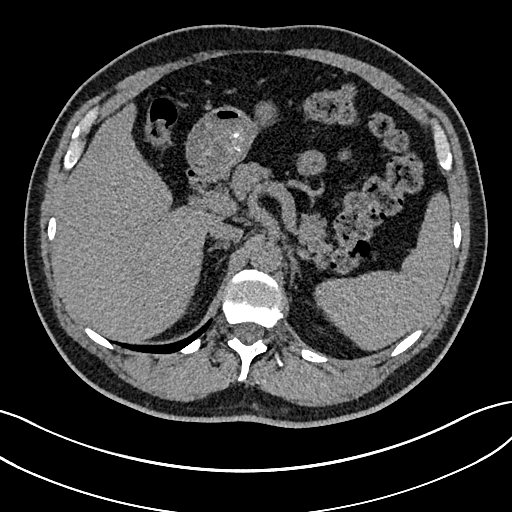
[im 14/170  lung]
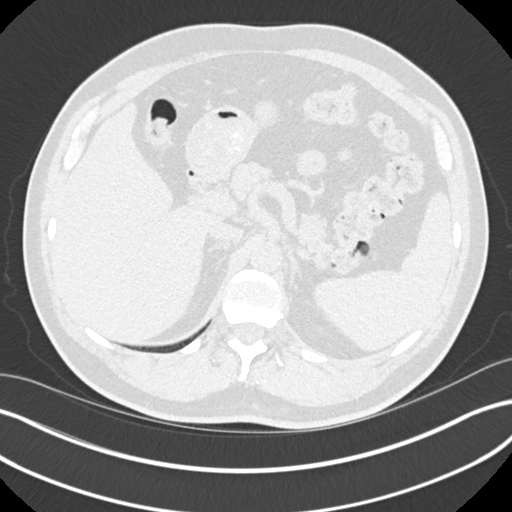
[im 27/170  lung]
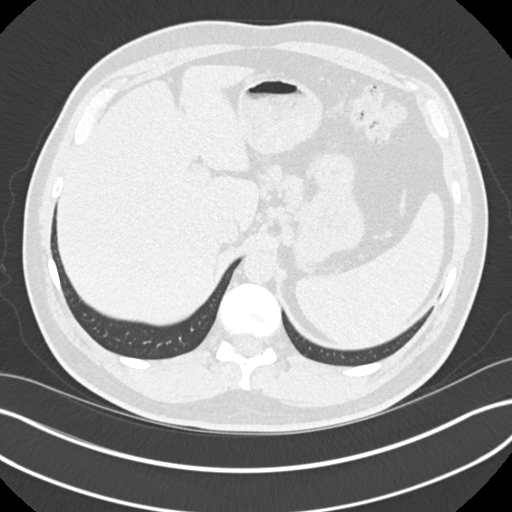
[im 40/170  lung]
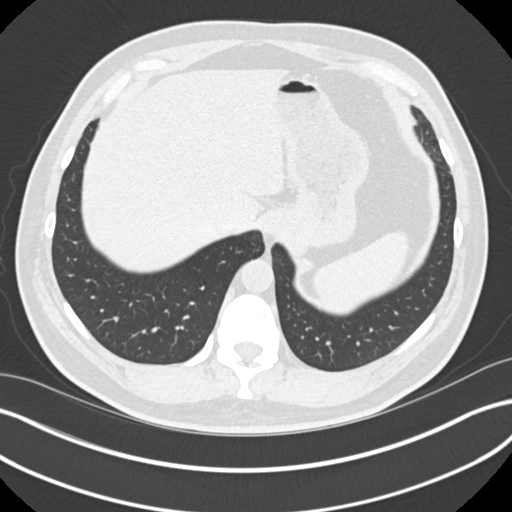
[im 53/170  lung]
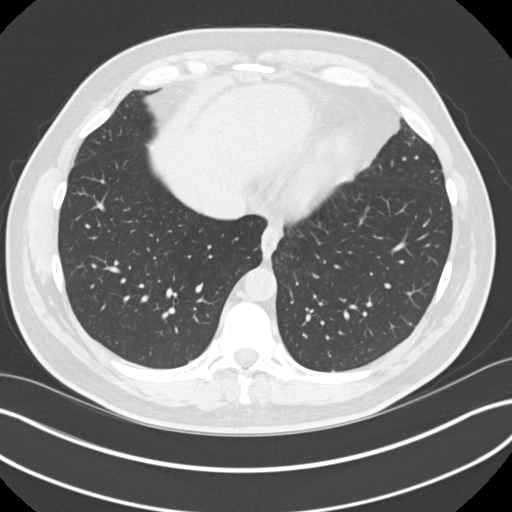
[im 66/170  mediastinal]
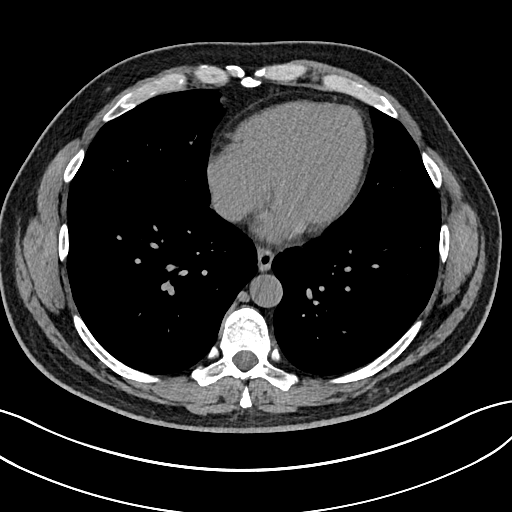
[im 66/170  lung]
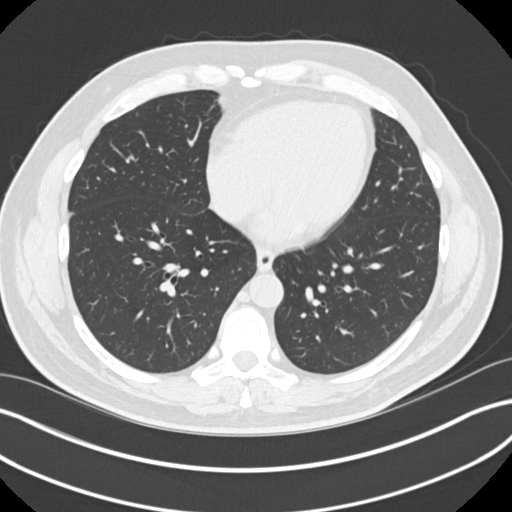
[im 79/170  lung]
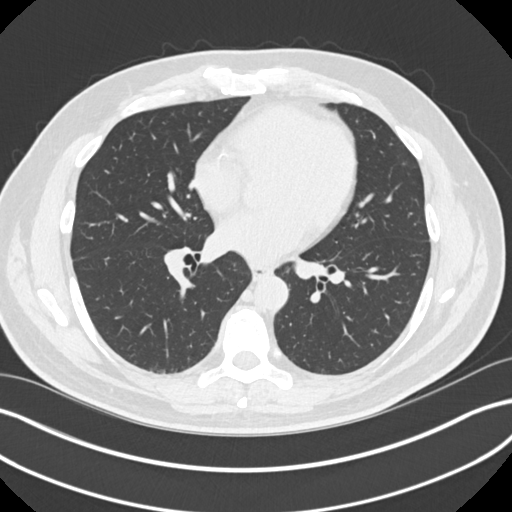
[im 92/170  lung]
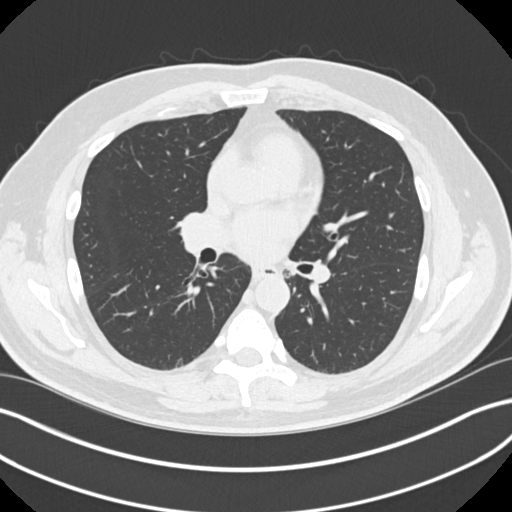
[im 105/170  lung]
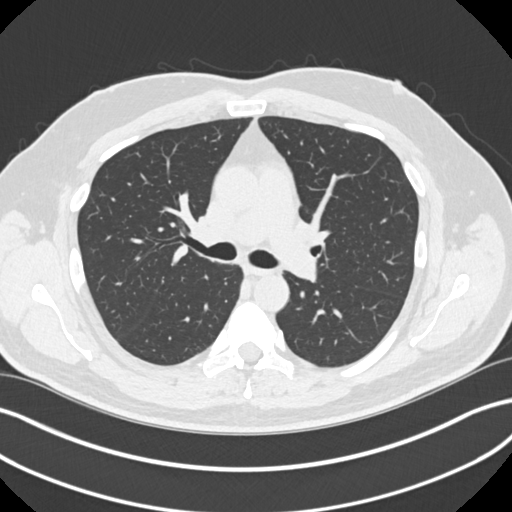
[im 118/170  mediastinal]
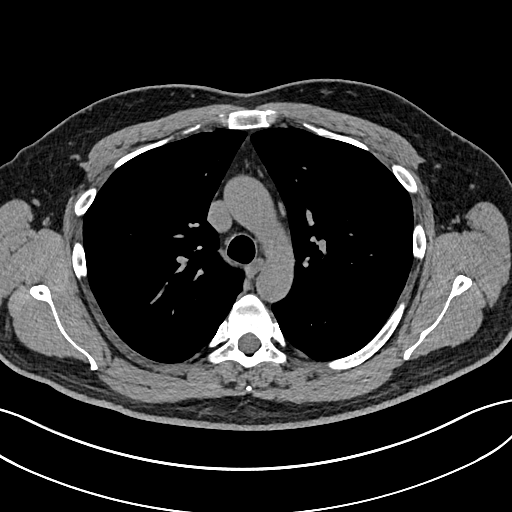
[im 118/170  lung]
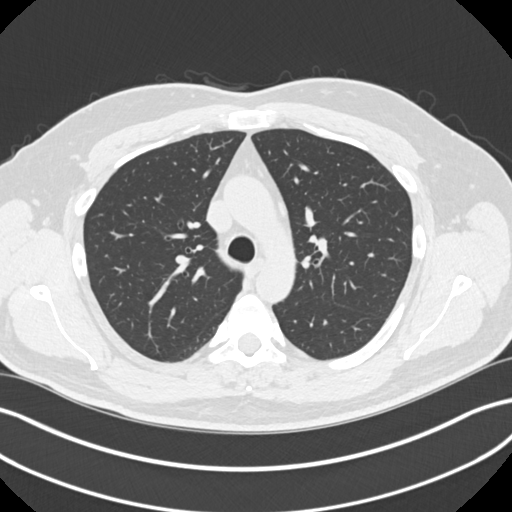
[im 131/170  lung]
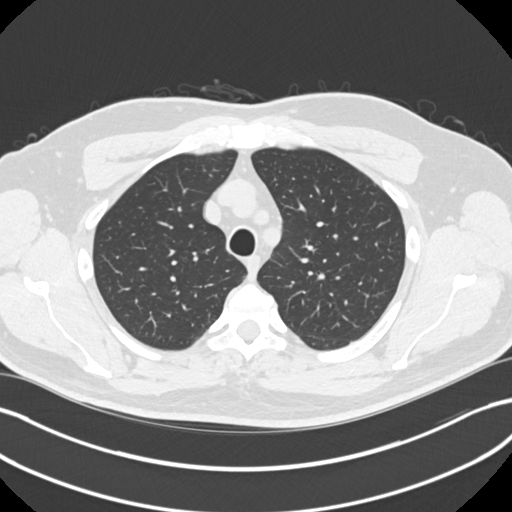
[im 144/170  lung]
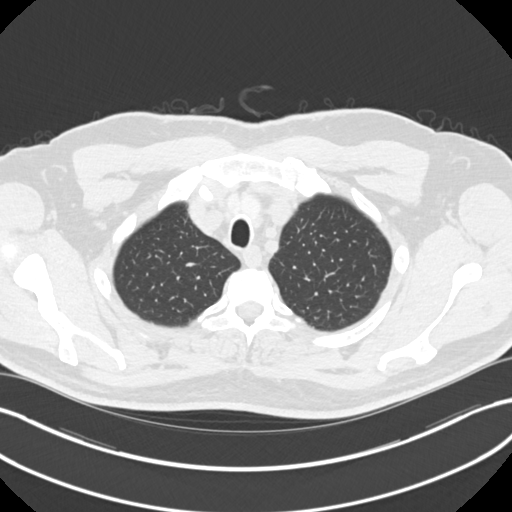
[im 157/170  lung]
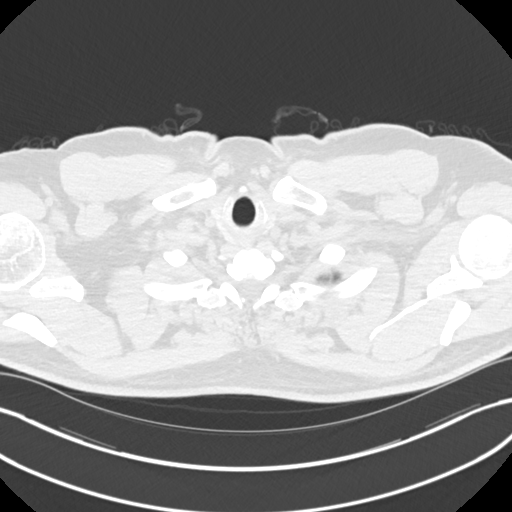

[Series 5: coronal chest · coronal · 0.67mm/px · 3 of 158 slices shown]
[im 32/158  lung]
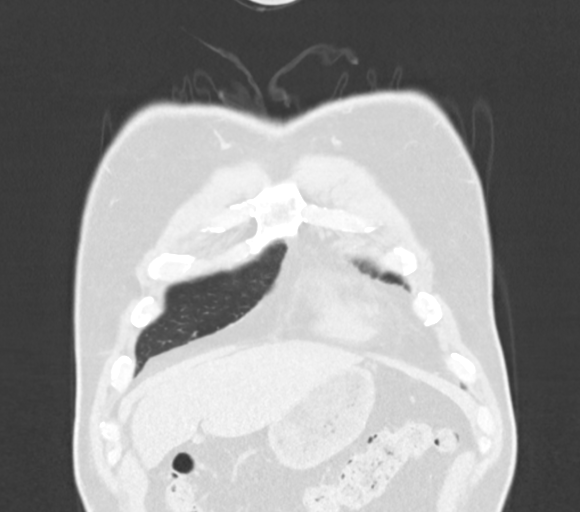
[im 63/158  lung]
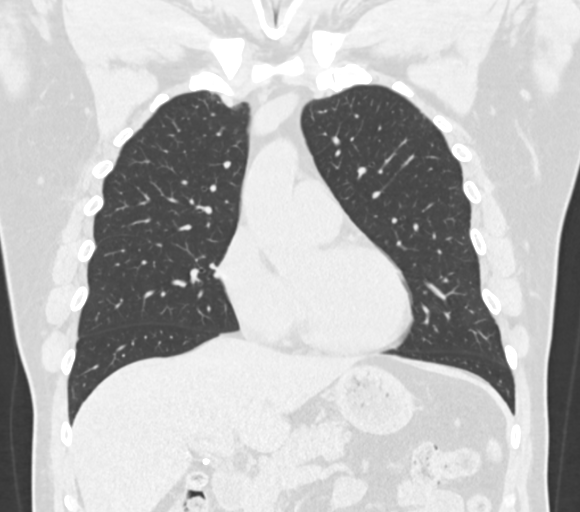
[im 95/158  lung]
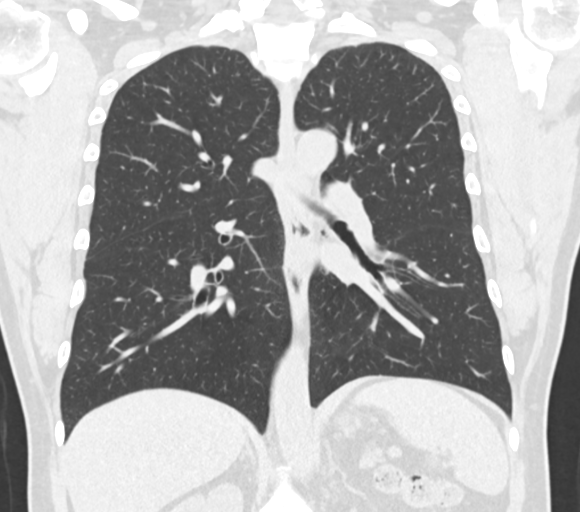

[15 of 36 positions shown; findings below may reference images not displayed]

FINDINGS: Evaluation of this exam is limited in the absence of intravenous
contrast.

Cardiovascular: There is no cardiomegaly or pericardial effusion.
Mild coronary vascular calcification primarily involving the RCA.
The thoracic aorta and the central pulmonary arteries are
unremarkable on this noncontrast study.

Mediastinum/Nodes: No hilar or mediastinal adenopathy. The esophagus
and the thyroid gland are grossly unremarkable. No mediastinal fluid
collection.

Lungs/Pleura: There is a 6 mm nodule along the right major fissure
(series 3, image 89). A 2 mm subpleural nodule versus scarring in
the right upper lobe (series 3, image 43). The lungs are clear.
There is no pleural effusion or pneumothorax. The central airways
are patent.

Upper Abdomen: Cholecystectomy. Mild fatty liver.

Musculoskeletal: No chest wall mass or suspicious bone lesions
identified.
IMPRESSION: 1. No acute intrathoracic pathology.
2. A 6 mm nodule along the right major fissure. Non-contrast chest
CT at 6-12 months is recommended. If the nodule is stable at time of
repeat CT, then future CT at 18-24 months (from today's scan) is
considered optional for low-risk patients, but is recommended for
high-risk patients. This recommendation follows the consensus
statement: Guidelines for Management of Incidental Pulmonary Nodules
Detected on CT Images: From the [HOSPITAL] 2117; Radiology
3. Mild coronary vascular calcification primarily involving the RCA.
4. Fatty liver.

## 2021-05-23 ENCOUNTER — Other Ambulatory Visit: Payer: Self-pay

## 2021-11-18 ENCOUNTER — Other Ambulatory Visit: Payer: Self-pay

## 2021-11-18 MED ORDER — CYANOCOBALAMIN 1000 MCG/ML IJ SOLN
INTRAMUSCULAR | 1 refills | Status: AC
  Filled 2021-11-18: qty 3, 90d supply, fill #0

## 2021-11-18 MED ORDER — SILDENAFIL CITRATE 20 MG PO TABS
ORAL_TABLET | ORAL | 3 refills | Status: AC
  Filled 2021-11-18: qty 90, 90d supply, fill #0

## 2022-02-28 ENCOUNTER — Other Ambulatory Visit: Payer: Self-pay | Admitting: Urology

## 2022-06-23 ENCOUNTER — Ambulatory Visit
Admission: EM | Admit: 2022-06-23 | Discharge: 2022-06-23 | Disposition: A | Discharge status: Home / Self Care | Payer: BC Managed Care – PPO | Attending: Family Medicine | Admitting: Family Medicine

## 2022-06-23 ENCOUNTER — Other Ambulatory Visit: Payer: Self-pay

## 2022-06-23 ENCOUNTER — Emergency Department
Admission: EM | Admit: 2022-06-23 | Discharge: 2022-06-23 | Disposition: A | Discharge status: Home / Self Care | Payer: BC Managed Care – PPO | Attending: Emergency Medicine | Admitting: Emergency Medicine

## 2022-06-23 ENCOUNTER — Emergency Department: Payer: BC Managed Care – PPO

## 2022-06-23 DIAGNOSIS — K1120 Sialoadenitis, unspecified: Secondary | ICD-10-CM | POA: Diagnosis not present

## 2022-06-23 DIAGNOSIS — R220 Localized swelling, mass and lump, head: Secondary | ICD-10-CM | POA: Diagnosis present

## 2022-06-23 DIAGNOSIS — R221 Localized swelling, mass and lump, neck: Secondary | ICD-10-CM

## 2022-06-23 DIAGNOSIS — K122 Cellulitis and abscess of mouth: Secondary | ICD-10-CM

## 2022-06-23 LAB — COMPREHENSIVE METABOLIC PANEL
ALT: 21 U/L (ref 0–44)
AST: 17 U/L (ref 15–41)
Albumin: 4.2 g/dL (ref 3.5–5.0)
Alkaline Phosphatase: 57 U/L (ref 38–126)
Anion gap: 8 (ref 5–15)
BUN: 16 mg/dL (ref 6–20)
CO2: 24 mmol/L (ref 22–32)
Calcium: 8.5 mg/dL — ABNORMAL LOW (ref 8.9–10.3)
Chloride: 108 mmol/L (ref 98–111)
Creatinine, Ser: 0.91 mg/dL (ref 0.61–1.24)
GFR, Estimated: >60 mL/min (ref >60)
Glucose, Bld: 93 mg/dL (ref 70–99)
Potassium: 3.5 mmol/L (ref 3.5–5.1)
Sodium: 140 mmol/L (ref 135–145)
Total Bilirubin: 0.9 mg/dL (ref 0.3–1.2)
Total Protein: 7.1 g/dL (ref 6.5–8.1)

## 2022-06-23 LAB — CBC WITH DIFFERENTIAL/PLATELET
Abs Immature Granulocytes: 0.04 10*3/uL (ref 0.00–0.07)
Basophils Absolute: 0.1 10*3/uL (ref 0.0–0.1)
Basophils Relative: 1 %
Eosinophils Absolute: 0.2 10*3/uL (ref 0.0–0.5)
Eosinophils Relative: 1 %
HCT: 46.4 % (ref 39.0–52.0)
Hemoglobin: 16.1 g/dL (ref 13.0–17.0)
Immature Granulocytes: 0 %
Lymphocytes Relative: 18 %
Lymphs Abs: 2.2 10*3/uL (ref 0.7–4.0)
MCH: 30 pg (ref 26.0–34.0)
MCHC: 34.7 g/dL (ref 30.0–36.0)
MCV: 86.6 fL (ref 80.0–100.0)
Monocytes Absolute: 1.2 10*3/uL — ABNORMAL HIGH (ref 0.1–1.0)
Monocytes Relative: 10 %
Neutro Abs: 8.5 10*3/uL — ABNORMAL HIGH (ref 1.7–7.7)
Neutrophils Relative %: 70 %
Platelets: 182 10*3/uL (ref 150–400)
RBC: 5.36 MIL/uL (ref 4.22–5.81)
RDW: 13.1 % (ref 11.5–15.5)
WBC: 12.1 10*3/uL — ABNORMAL HIGH (ref 4.0–10.5)
nRBC: 0 % (ref 0.0–0.2)

## 2022-06-23 MED ORDER — OXYCODONE HCL 5 MG PO TABS
10.0000 mg | ORAL_TABLET | Freq: Once | ORAL | Status: AC
  Administered 2022-06-23: 10 mg via ORAL
  Filled 2022-06-23: qty 2

## 2022-06-23 MED ORDER — MORPHINE SULFATE (PF) 4 MG/ML IV SOLN
4.0000 mg | Freq: Once | INTRAVENOUS | Status: AC
  Administered 2022-06-23: 4 mg via INTRAVENOUS
  Filled 2022-06-23: qty 1

## 2022-06-23 MED ORDER — IOHEXOL 300 MG/ML  SOLN
75.0000 mL | Freq: Once | INTRAMUSCULAR | Status: AC | PRN
  Administered 2022-06-23: 75 mL via INTRAVENOUS

## 2022-06-23 MED ORDER — DEXAMETHASONE SODIUM PHOSPHATE 10 MG/ML IJ SOLN
12.0000 mg | Freq: Once | INTRAMUSCULAR | Status: AC
  Administered 2022-06-23: 12 mg via INTRAVENOUS
  Filled 2022-06-23: qty 2

## 2022-06-23 MED ORDER — AMOXICILLIN-POT CLAVULANATE 875-125 MG PO TABS: 1.0000 | ORAL_TABLET | Freq: Two times a day (BID) | ORAL | 0 refills | Status: AC

## 2022-06-23 MED ORDER — SODIUM CHLORIDE 0.9 % IV SOLN
3.0000 g | Freq: Once | INTRAVENOUS | Status: AC
  Administered 2022-06-23: 3 g via INTRAVENOUS
  Filled 2022-06-23: qty 8

## 2022-06-23 NOTE — Discharge Instructions (Addendum)
Take antibiotics for the full course as prescribed.  Call Dr. Jenne Campus first thing Monday morning to schedule follow-up appointment.  Use sour candies or sour liquids like lemon juice to stimulate the salivary glands to expel any stones if they are causing a blockage.  Take acetaminophen 650 mg and ibuprofen 400 mg every 6 hours for pain.  Take with food.  It is important that you return to the emergency department immediately if you develop worsening swelling especially if it begins to impede your airway and cause trouble breathing or swallowing.

## 2022-06-23 NOTE — ED Triage Notes (Signed)
Pt c/o left side neck swelling and ear pain x2days  Pt states that his neck has gotten larger since yesterday. Pt was worried about allergies as he mowed the grass yesterday.  Pt can touch the left side of his neck and feels no pain but right side is tender to touch.   Pt states that his jaw hurts when opening and closing his mouth due to swelling.

## 2022-06-23 NOTE — ED Notes (Signed)
Patient is being discharged from the Urgent Care and sent to the Emergency Department via Personal Vehicle . Per Dr. Geannie Risen, patient is in need of higher level of care due to Neck Swelling. Patient is aware and verbalizes understanding of plan of care.  Vitals:   06/23/22 1848  BP: (!) 166/109  Pulse: 76  Temp: 98.1 F (36.7 C)  SpO2: 97%

## 2022-06-23 NOTE — ED Triage Notes (Signed)
Large abscess to R jaw/throat. Sent from urgent care for further evaluation. Pt reports ear pain x 2 days and thought initially the swelling was due to allergies/sinuses. Pt noted to be having difficulty opening mouth fully. No swelling to uvula noted but pt reports his tongue feels slightly swollen. Pt is speaking in full sentences and maintaining secretions with ease. Pt reports having tonsils removed as a child. Pt reports lower R dental pain today but only onset after the swelling worsened. Pt ambulatory to triage. Alert and oriented following commands.

## 2022-06-23 NOTE — ED Provider Notes (Signed)
[facility] Provider Note    Event Date/Time   First MD Initiated Contact with Patient 06/23/22 1959     (approximate)   History   Abscess (/)   HPI  DAM ASHRAF is a 57 y.o. male   Past medical history of no significant past medical history presents with submandibular swelling on the right side that is worsening over the last 2 days.  No associated fever or chills.  No dental infections or dental procedures recently.  No IV drug use or other immunocompromising factors.  No problems with swallowing or breathing.  There is pain when he touches it.  No other acute medical complaints  Independent Historian contributed to assessment above: His wife was at bedside corroborates past medical history and history of present illness as above.  External Medical Documents Reviewed: Urgent care note from earlier today assessing his swelling of the neck and advising coming to the emergency department for further evaluation      Physical Exam   Triage Vital Signs: ED Triage Vitals  Enc Vitals Group     BP 06/23/22 1926 (!) 182/111     Pulse Rate 06/23/22 1926 82     Resp 06/23/22 1926 18     Temp 06/23/22 1928 98.3 F (36.8 C)     Temp Source 06/23/22 1928 Oral     SpO2 06/23/22 1926 96 %     Weight 06/23/22 1927 214 lb (97.1 kg)     Height 06/23/22 1927  (1.88 m)     Head Circumference --      Peak Flow --      Pain Score 06/23/22 1927 3     Pain Loc --      Pain Edu? --      Excl. in GC? --     Most recent vital signs: Vitals:   06/23/22 1928 06/23/22 2013  BP:  (!) 158/101  Pulse:  80  Resp:  18  Temp: 98.3 F (36.8 C) 98.4 F (36.9 C)  SpO2:  100%    General: Awake, no distress.  CV:  Good peripheral perfusion.  Resp:  Normal effort.  Abd:  No distention.  Other:  There is a firm indurated lesion on the right submandibular side is tender to touch.  His neck is supple with full range of motion he opens his mouth completely with  no trismus and there is no intraoral, dental, posterior oropharynx abnormalities on my exam.  Phonation is normal.  Breathing comfortably.  Hemodynamics appropriate reassuring no fever.   ED Results / Procedures / Treatments   Labs (all labs ordered are listed, but only abnormal results are displayed) Labs Reviewed  CBC WITH DIFFERENTIAL/PLATELET - Abnormal; Notable for the following components:      Result Value   WBC 12.1 (*)    Neutro Abs 8.5 (*)    Monocytes Absolute 1.2 (*)    All other components within normal limits  COMPREHENSIVE METABOLIC PANEL - Abnormal; Notable for the following components:   Calcium 8.5 (*)    All other components within normal limits     I ordered and reviewed the above labs they are notable for elevated white blood cell count at 12  RADIOLOGY I independently reviewed and interpreted CT scan of the neck and I see a mass on the front submandibular area right-sided   PROCEDURES:  Critical Care performed: No  Procedures   MEDICATIONS ORDERED IN ED: Medications  dexamethasone (DECADRON) injection  12 mg (has no administration in time range)  Ampicillin-Sulbactam (UNASYN) 3 g in sodium chloride 0.9 % 100 mL IVPB (has no administration in time range)  morphine (PF) 4 MG/ML injection 4 mg (4 mg Intravenous Given 06/23/22 2022)  iohexol (OMNIPAQUE) 300 MG/ML solution 75 mL (75 mLs Intravenous Contrast Given 06/23/22 2038)    IMPRESSION / MDM / ASSESSMENT AND PLAN / ED COURSE  I reviewed the triage vital signs and the nursing notes.                                Patient's presentation is most consistent with acute presentation with potential threat to life or bodily function.  Differential diagnosis includes, but is not limited to, sialoadenitis/lithiasis, abscess, odontogenic infection, airway obstruction, Ludwig's angina   The patient is on the cardiac monitor to evaluate for evidence of arrhythmia and/or significant heart rate  changes.  MDM: Patient with sialoadenitis on imaging without discernible abscess.  Airway intact nontoxic appearance I doubt Ludwick's or sepsis.  Will treat with antibiotics with first dose Unasyn in the emergency department along with IV dexamethasone for swelling, and then proceed with outpatient prescription for Augmentin and advised sialologues like a sour candies/lemon drops and will follow-up with ENT by calling next business day.  Return precautions given for any new or worsening.  I considered hospitalization for admission or observation however given stability in the emergency department with no drainable abscess noted on CT imaging, no airway compromise, and a plan for outpatient treatment with ENT follow-up and strict return precautions I think outpatient monitoring and follow-up most appropriate at this time.         FINAL CLINICAL IMPRESSION(S) / ED DIAGNOSES   Final diagnoses:  Sialoadenitis of submandibular gland     Rx / DC Orders   ED Discharge Orders     None        Note:  This document was prepared using Dragon voice recognition software and may include unintentional dictation errors.    Pilar Jarvis, MD 06/23/22 2141

## 2022-06-23 NOTE — Discharge Instructions (Addendum)
I am concerned you have something called Ludwig's angina.   You have been advised to follow up immediately in the emergency department for concerning signs or symptoms as discussed during your visit. If you declined EMS transport, please have a family member take you directly to the ED at this time. Do not delay.   Based on concerns about condition, if you do not follow up in the ED, you may risk poor outcomes including worsening of condition, delayed treatment and potentially life threatening issues. If you have declined to go to the ED at this time, you should call your PCP immediately to set up a follow up appointment.   Go to ED for red flag symptoms, including; fevers you cannot reduce with Tylenol/Motrin, severe headaches, vision changes, numbness/weakness in part of the body, lethargy, confusion, intractable vomiting, severe dehydration, chest pain, breathing difficulty, severe persistent abdominal or pelvic pain, signs of severe infection (increased redness, swelling of an area), feeling faint or passing out, dizziness, etc. You should especially go to the ED for sudden acute worsening of condition if you do not elect to go at this time.

## 2022-06-23 NOTE — ED Provider Notes (Signed)
[facility]    CSN: [id number] Arrival date & time: 06/23/22  1828      History   Chief Complaint Chief Complaint  Patient presents with  . Neck swelling    HPI Brent Cochran is a 57 y.o. male.   HPI   Brent Cochran presents for left-sided neck swelling and left ear pain for the past 2 days.  The area has grown significantly larger in size since yesterday.  He has difficulty opening his mouth. Notes some mild pain with swallowing. He has been taking OTC analgesics for pain. He has smoked <1 pack of cigarettes in the past few months.  Denies shortness of breath, fever, cough.       History reviewed. No pertinent past medical history.  Patient Active Problem List   Diagnosis Date Noted  . ED (erectile dysfunction) 07/24/2013  . Environmental allergies 07/24/2013  . Paresthesia 07/24/2013  . Snoring 07/24/2013  . Tobacco abuse 07/24/2013    Past Surgical History:  Procedure Laterality Date  . CHOLECYSTECTOMY    . PLANTAR FASCIA SURGERY    . TONSILLECTOMY AND ADENOIDECTOMY    . VASECTOMY         Home Medications    Prior to Admission medications   Medication Sig Start Date End Date Taking? Authorizing Provider  cyanocobalamin (VITAMIN B12) 1000 MCG/ML injection Inject 1 mL (1,000 mcg total) into the muscle monthly 11/18/21  Yes   sildenafil (REVATIO) 20 MG tablet as needed. 10/19/16  Yes [provider]  sildenafil (REVATIO) 20 MG tablet TAKE 3 TO 5 TABLETS EVERY DAILY AS NEEDED FOR ED. 11/18/21  Yes   amoxicillin-clavulanate (AUGMENTIN) 875-125 MG tablet Take 1 tablet by mouth 2 (two) times daily. 05/03/18   Payton Mccallum, MD  NONFORMULARY OR COMPOUNDED ITEM Trimix (30/1/10)-(Pap/Phent/PGE)  Dosage: Inject 0.5 cc per injection  Prefilled Syringe # Test Dose 3ml vial Vial  3ml   Qty #10  Refills #6  [facility] [phone] Fax [phone] 06/27/18   Riki Altes, MD  ondansetron (ZOFRAN-ODT) 4 MG disintegrating tablet Take  1 tablet (4 mg total) by mouth every 8 (eight) hours as needed for nausea or vomiting. 02/15/18   Tommie Sams, DO  predniSONE (DELTASONE) 20 MG tablet 2 tabs po qd for 7 days 05/03/18   Payton Mccallum, MD    Family History Family History  Problem Relation Age of Onset  . Prostate cancer Father   . Colon cancer Mother     Social History Social History   Tobacco Use  . Smoking status: Former    Years: 35    Types: Cigarettes    Quit date: 04/27/2014    Years since quitting: 8.1  . Smokeless tobacco: Never  Vaping Use  . Vaping Use: Never used  Substance Use Topics  . Alcohol use: Yes  . Drug use: No     Allergies   Patient has no known allergies.   Review of Systems Review of Systems: negative unless otherwise stated in HPI.      Physical Exam Triage Vital Signs ED Triage Vitals  Enc Vitals Group     BP 06/23/22 1848 (!) 166/109     Pulse Rate 06/23/22 1848 76     Resp --      Temp 06/23/22 1848 98.1 F (36.7 C)     Temp Source 06/23/22 1848 Oral     SpO2 06/23/22 1848 97 %     Weight 06/23/22 1846 214 lb (97.1 kg)  Height 06/23/22 1846  (1.88 m)     Head Circumference --      Peak Flow --      Pain Score 06/23/22 1846 7     Pain Loc --      Pain Edu? --      Excl. in GC? --    No data found.  Updated Vital Signs BP (!) 166/109 (BP Location: Left Arm)   Pulse 76   Temp 98.1 F (36.7 C) (Oral)   Ht  (1.88 m)   Wt 97.1 kg   SpO2 97%   BMI 27.48 kg/m   Visual Acuity Right Eye Distance:   Left Eye Distance:   Bilateral Distance:    Right Eye Near:   Left Eye Near:    Bilateral Near:     Physical Exam GEN:     alert, male in no distress    HENT:  mucus membranes moist, swelling under the tongue, oropharynx not visible as pt unable to open mouth wide, without lesions or ***exudate, no*** tonsillar hypertrophy, *** mild oropharyngeal erythema , *** moderate erythematous edematous turbinates, ***clear nasal discharge, ***bilateral  TM normal EYES:   pupils equal and reactive, ***no scleral injection or discharge NECK:  normal ROM, no ***lymphadenopathy, ***no meningismus   RESP:  no increased work of breathing, ***clear to auscultation bilaterally CVS:   regular rate ***and rhythm Skin:   warm and dry, no rash on visible skin***    UC Treatments / Results  Labs (all labs ordered are listed, but only abnormal results are displayed) Labs Reviewed - No data to display  EKG   Radiology No results found.  Procedures Procedures (including critical care time)  Medications Ordered in UC Medications - No data to display  Initial Impression / Assessment and Plan / UC Course  I have reviewed the triage vital signs and the nursing notes.  Pertinent labs & imaging results that were available during my care of the patient were reviewed by me and considered in my medical decision making (see chart for details).       Pt is a 57 y.o. male who presents for *** days of respiratory symptoms. Reggie is ***afebrile here without recent antipyretics. Satting well on room air. Overall pt is ***non-toxic appearing, well hydrated, without respiratory distress. Pulmonary exam ***is unremarkable.  COVID and influenza testing obtained ***and was negative. ***Pt to quarantine until COVID test results or longer if positive.  I will call patient with test results, if positive. History consistent with ***viral respiratory illness. Discussed symptomatic treatment.  Explained lack of efficacy of antibiotics in viral disease.  Typical duration of symptoms discussed.   Return and ED precautions given and voiced understanding. Discussed MDM, treatment plan and plan for follow-up with patient/guardian*** who agrees with plan.     Final Clinical Impressions(s) / UC Diagnoses   Final diagnoses:  None   Discharge Instructions   None    ED Prescriptions   None    PDMP not reviewed this encounter.

## 2023-08-31 ENCOUNTER — Other Ambulatory Visit: Payer: Self-pay

## 2023-08-31 MED ORDER — SILDENAFIL CITRATE 20 MG PO TABS
20.0000 mg | ORAL_TABLET | ORAL | 3 refills | Status: DC
  Filled 2023-08-31: qty 90, 18d supply, fill #0

## 2024-01-14 ENCOUNTER — Ambulatory Visit: Admitting: Anesthesiology

## 2024-01-14 ENCOUNTER — Encounter: Payer: Self-pay | Admitting: Gastroenterology

## 2024-01-14 ENCOUNTER — Encounter
Admission: RE | Disposition: A | Discharge status: Home / Self Care | Payer: Self-pay | Source: Home / Self Care | Attending: Gastroenterology

## 2024-01-14 ENCOUNTER — Ambulatory Visit
Admission: RE | Admit: 2024-01-14 | Discharge: 2024-01-14 | Disposition: A | Discharge status: Home / Self Care | Attending: Gastroenterology | Admitting: Gastroenterology

## 2024-01-14 DIAGNOSIS — Z1211 Encounter for screening for malignant neoplasm of colon: Secondary | ICD-10-CM | POA: Insufficient documentation

## 2024-01-14 DIAGNOSIS — F17210 Nicotine dependence, cigarettes, uncomplicated: Secondary | ICD-10-CM | POA: Diagnosis not present

## 2024-01-14 DIAGNOSIS — Z800 Family history of malignant neoplasm of digestive organs: Secondary | ICD-10-CM | POA: Diagnosis not present

## 2024-01-14 DIAGNOSIS — D122 Benign neoplasm of ascending colon: Secondary | ICD-10-CM | POA: Diagnosis not present

## 2024-01-14 DIAGNOSIS — K5730 Diverticulosis of large intestine without perforation or abscess without bleeding: Secondary | ICD-10-CM | POA: Diagnosis not present

## 2024-01-14 DIAGNOSIS — D125 Benign neoplasm of sigmoid colon: Secondary | ICD-10-CM | POA: Diagnosis not present

## 2024-01-14 HISTORY — PX: POLYPECTOMY: SHX149

## 2024-01-14 HISTORY — PX: COLONOSCOPY: SHX5424

## 2024-01-14 SURGERY — COLONOSCOPY
Anesthesia: General

## 2024-01-14 MED ORDER — LIDOCAINE HCL (PF) 2 % IJ SOLN
INTRAMUSCULAR | Status: AC
  Filled 2024-01-14: qty 5

## 2024-01-14 MED ORDER — PROPOFOL 10 MG/ML IV BOLUS
INTRAVENOUS | Status: DC | PRN
  Administered 2024-01-14: 60 mg via INTRAVENOUS
  Administered 2024-01-14: 150 ug/kg/min via INTRAVENOUS
  Administered 2024-01-14: 40 mg via INTRAVENOUS

## 2024-01-14 MED ORDER — PROPOFOL 1000 MG/100ML IV EMUL
INTRAVENOUS | Status: AC
  Filled 2024-01-14: qty 100

## 2024-01-14 MED ORDER — SODIUM CHLORIDE 0.9 % IV SOLN
INTRAVENOUS | Status: DC
  Administered 2024-01-14: 500 mL via INTRAVENOUS

## 2024-01-14 MED ORDER — DEXMEDETOMIDINE HCL IN NACL 200 MCG/50ML IV SOLN
INTRAVENOUS | Status: DC | PRN
  Administered 2024-01-14: 12 ug via INTRAVENOUS

## 2024-01-14 MED ORDER — PHENYLEPHRINE 80 MCG/ML (10ML) SYRINGE FOR IV PUSH (FOR BLOOD PRESSURE SUPPORT)
PREFILLED_SYRINGE | INTRAVENOUS | Status: DC | PRN
  Administered 2024-01-14: 160 ug via INTRAVENOUS

## 2024-01-14 MED ORDER — LIDOCAINE HCL (CARDIAC) PF 100 MG/5ML IV SOSY
PREFILLED_SYRINGE | INTRAVENOUS | Status: DC | PRN
  Administered 2024-01-14: 50 mg via INTRAVENOUS

## 2024-01-14 MED ORDER — GLYCOPYRROLATE 0.2 MG/ML IJ SOLN
INTRAMUSCULAR | Status: AC
  Filled 2024-01-14: qty 1

## 2024-01-14 NOTE — Anesthesia Preprocedure Evaluation (Signed)
 Anesthesia Evaluation  Patient identified by MRN, date of birth, ID band Patient awake    Reviewed: Allergy & Precautions, NPO status , Patient's Chart, lab work & pertinent test results  Airway Mallampati: II  TM Distance: >3 FB Neck ROM: Full    Dental  (+) Teeth Intact   Pulmonary neg pulmonary ROS, Current Smoker   Pulmonary exam normal        Cardiovascular Exercise Tolerance: Good negative cardio ROS Normal cardiovascular exam Rhythm:Regular Rate:Normal     Neuro/Psych negative neurological ROS  negative psych ROS   GI/Hepatic negative GI ROS, Neg liver ROS,,,  Endo/Other  negative endocrine ROS    Renal/GU negative Renal ROS  negative genitourinary   Musculoskeletal   Abdominal  (+) + obese  Peds negative pediatric ROS (+)  Hematology negative hematology ROS (+)   Anesthesia Other Findings History reviewed. No pertinent past medical history.  Past Surgical History: No date: CHOLECYSTECTOMY No date: PLANTAR FASCIA SURGERY No date: TONSILLECTOMY AND ADENOIDECTOMY No date: VASECTOMY  BMI    Body Mass Index: 28.12 kg/m      Reproductive/Obstetrics negative OB ROS                              Anesthesia Physical Anesthesia Plan  ASA: 2  Anesthesia Plan: General   Post-op Pain Management:    Induction: Intravenous  PONV Risk Score and Plan: Propofol infusion and TIVA  Airway Management Planned: Natural Airway and Nasal Cannula  Additional Equipment:   Intra-op Plan:   Post-operative Plan:   Informed Consent: I have reviewed the patients History and Physical, chart, labs and discussed the procedure including the risks, benefits and alternatives for the proposed anesthesia with the patient or authorized representative who has indicated his/her understanding and acceptance.     Dental Advisory Given  Plan Discussed with: CRNA  Anesthesia Plan Comments:          Anesthesia Quick Evaluation

## 2024-01-14 NOTE — Op Note (Signed)
 [facility] Patient Name: Brent Cochran Procedure Date: 01/14/2024 7:16 AM MRN: [id number] Account #: [id number] Date of Birth: 02/07/1966 Admit Type: Outpatient Age: 58 Room: [facility] ENDO ROOM 1 Gender: Male Note Status: Finalized Instrument Name: Colon Scope [id number] Procedure:             Colonoscopy Indications:           Screening for colorectal malignant neoplasm, Screening                         in patient at increased risk: Family history of                         1st-degree relative with colorectal cancer Providers:             Ruel Kung MD, MD Referring MD:          Reyes BIRCH. Auston, MD (Referring MD) Medicines:             Monitored Anesthesia Care Complications:         No immediate complications. Procedure:             Pre-Anesthesia Assessment:                        - Prior to the procedure, a History and Physical was                         performed, and patient medications, allergies and                         sensitivities were reviewed. The patient's tolerance                         of previous anesthesia was reviewed.                        - The risks and benefits of the procedure and the                         sedation options and risks were discussed with the                         patient. All questions were answered and informed                         consent was obtained.                        - The risks and benefits of the procedure and the                         sedation options and risks were discussed with the                         patient. All questions were answered and informed                         consent was obtained.                        -  ASA Grade Assessment: II - A patient with mild                         systemic disease.                        After obtaining informed consent, the colonoscope was                         passed under direct vision. Throughout the procedure,                          the patient's blood pressure, pulse, and oxygen                         saturations were monitored continuously. The                         Colonoscope was introduced through the anus and                         advanced to the the cecum, identified by the                         appendiceal orifice. The colonoscopy was performed                         with ease. The patient tolerated the procedure well.                         The quality of the bowel preparation was adequate. The                         ileocecal valve, appendiceal orifice, and rectum were                         photographed. Findings:      The perianal and digital rectal examinations were normal.      Multiple medium-mouthed diverticula were found in the entire colon.      A 3 mm polyp was found in the ascending colon. The polyp was sessile.       The polyp was removed with a cold biopsy forceps. Resection and       retrieval were complete.      Two sessile polyps were found in the sigmoid colon. The polyps were 4 to       5 mm in size. These polyps were removed with a cold snare. Resection and       retrieval were complete.      The exam was otherwise without abnormality on direct and retroflexion       views. Impression:            - Diverticulosis in the entire examined colon.                        - One 3 mm polyp in the ascending colon, removed with                         a cold biopsy forceps. Resected and retrieved.                        -  Two 4 to 5 mm polyps in the sigmoid colon, removed                         with a cold snare. Resected and retrieved.                        - The examination was otherwise normal on direct and                         retroflexion views. Recommendation:        - Discharge patient to home (with escort).                        - Resume previous diet.                        - Continue present medications.                        - Await pathology results.                         - Repeat colonoscopy in 3 - 5 years for surveillance                         based on pathology results. Procedure Code(s):     --- Professional ---                        [phone], Colonoscopy, flexible; with removal of                         tumor(s), polyp(s), or other lesion(s) by snare                         technique                        45380, 59, Colonoscopy, flexible; with biopsy, single                         or multiple Diagnosis Code(s):     --- Professional ---                        Z80.0, Family history of malignant neoplasm of                         digestive organs                        Z12.11, Encounter for screening for malignant neoplasm                         of colon                        D12.2, Benign neoplasm of ascending colon                        D12.5, Benign neoplasm of sigmoid colon  K57.30, Diverticulosis of large intestine without                         perforation or abscess without bleeding CPT copyright 2022 [employer] Association. All rights reserved. The codes documented in this report are preliminary and upon coder review may  be revised to meet current compliance requirements. Ruel Kung, MD Ruel Kung MD, MD 01/14/2024 8:14:33 AM This report has been signed electronically. Number of Addenda: 0 Note Initiated On: 01/14/2024 7:16 AM Scope Withdrawal Time: 0 hours 11 minutes 27 seconds  Total Procedure Duration: 0 hours 14 minutes 22 seconds  Estimated Blood Loss:  Estimated blood loss: none.      [facility]

## 2024-01-14 NOTE — Transfer of Care (Signed)
 Immediate Anesthesia Transfer of Care Note  Patient: Brent Cochran  Procedure(s) Performed: COLONOSCOPY POLYPECTOMY, INTESTINE  Patient Location: Endoscopy Unit  Anesthesia Type:General  Level of Consciousness: sedated and unresponsive  Airway & Oxygen Therapy: Patient Spontanous Breathing  Post-op Assessment: Report given to RN and Post -op Vital signs reviewed and stable  Post vital signs: Reviewed and stable  Last Vitals:  Vitals Value Taken Time  BP 100/67 01/14/24 08:13  Temp    Pulse 63 01/14/24 08:14  Resp 23 01/14/24 08:14  SpO2 97 % 01/14/24 08:14  Vitals shown include unfiled device data.  Last Pain:  Vitals:   01/14/24 0709  TempSrc: Temporal  PainSc: 0-No pain         Complications: No notable events documented.

## 2024-01-14 NOTE — Anesthesia Postprocedure Evaluation (Signed)
 Anesthesia Post Note  Patient: Brent Cochran  Procedure(s) Performed: COLONOSCOPY POLYPECTOMY, INTESTINE  Patient location during evaluation: PACU Anesthesia Type: General Level of consciousness: awake Pain management: satisfactory to patient Vital Signs Assessment: post-procedure vital signs reviewed and stable Respiratory status: spontaneous breathing Cardiovascular status: stable Anesthetic complications: no   No notable events documented.   Last Vitals:  Vitals:   01/14/24 0823 01/14/24 0833  BP: (!) 112/90 132/87  Pulse: 77 70  Resp: 18 15  Temp:    SpO2: 97% 97%    Last Pain:  Vitals:   01/14/24 0823  TempSrc:   PainSc: 0-No pain                 VAN STAVEREN,Marquet Faircloth

## 2024-01-14 NOTE — Anesthesia Procedure Notes (Signed)
 Date/Time: 01/14/2024 7:40 AM  Performed by: Dominica Krabbe, CRNAPre-anesthesia Checklist: Patient identified, Emergency Drugs available, Suction available, Patient being monitored and Timeout performed Patient Re-evaluated:Patient Re-evaluated prior to induction Oxygen Delivery Method: Nasal cannula Preoxygenation: Pre-oxygenation with 100% oxygen Induction Type: IV induction

## 2024-01-14 NOTE — H&P (Signed)
 Ruel Kung , MD [address], [address], [place], [place], [address] Phone: [phone] Fax: [phone]  Primary Care Physician:  Auston Reyes BIRCH, MD   Pre-Procedure History & Physical: HPI:  Brent Cochran is a 58 y.o. male is here for an colonoscopy.   History reviewed. No pertinent past medical history.  Past Surgical History:  Procedure Laterality Date   CHOLECYSTECTOMY     PLANTAR FASCIA SURGERY     TONSILLECTOMY AND ADENOIDECTOMY     VASECTOMY      Prior to Admission medications   Medication Sig Start Date End Date Taking? Authorizing Provider  cyanocobalamin  (VITAMIN B12) 1000 MCG/ML injection Inject 1 mL (1,000 mcg total) into the muscle monthly 11/18/21  Yes   NONFORMULARY OR COMPOUNDED ITEM Trimix (30/1/10)-(Pap/Phent/PGE)  Dosage: Inject 0.5 cc per injection  Prefilled Syringe # Test Dose 3ml vial Vial  3ml   Qty #10  Refills #6  [facility] [phone] Fax [phone] 06/27/18  Yes Stoioff, Glendia BROCKS, MD  ondansetron  (ZOFRAN -ODT) 4 MG disintegrating tablet Take 1 tablet (4 mg total) by mouth every 8 (eight) hours as needed for nausea or vomiting. 02/15/18  Yes Cook, Jayce G, DO  sildenafil  (REVATIO ) 20 MG tablet as needed. 10/19/16  Yes [provider]  predniSONE  (DELTASONE ) 20 MG tablet 2 tabs po qd for 7 days Patient not taking: Reported on 01/14/2024 05/03/18   Servando Hire, MD  sildenafil  (REVATIO ) 20 MG tablet TAKE 3 TO 5 TABLETS EVERY DAILY AS NEEDED FOR ED. 11/18/21       Allergies as of 01/11/2024   (No Known Allergies)    Family History  Problem Relation Age of Onset   Prostate cancer Father    Colon cancer Mother     Social History   Socioeconomic History   Marital status: Married    Spouse name: Not on file   Number of children: Not on file   Years of education: Not on file   Highest  education level: Not on file  Occupational History   Not on file  Tobacco Use   Smoking status: Some Days    Current packs/day: 0.00    Types: Cigarettes    Start date: 04/28/1979    Last attempt to quit: 04/27/2014    Years since quitting: 9.7   Smokeless tobacco: Never  Vaping Use   Vaping status: Never Used  Substance and Sexual Activity   Alcohol use: Yes   Drug use: No   Sexual activity: Yes    Birth control/protection: Surgical  Other Topics Concern   Not on file  Social History Narrative   Not on file   Social Drivers of Health   Financial Resource Strain: Low Risk  (12/18/2023)   Received from [facility]   Overall Financial Resource Strain (CARDIA)    Difficulty of Paying Living Expenses: Not very hard  Food Insecurity: No Food Insecurity (12/18/2023)   Received from [facility]   Hunger Vital Sign    Within the past 12 months, you worried that your food would run out before you got the money to buy more.: Never true    Within the past 12 months, the food you bought just didn't last and you didn't have money to get more.: Never true  Transportation Needs: Patient Declined (12/18/2023)   Received [facility] - [facility]    In the past 12 months, has lack of transportation kept  you from medical appointments or from getting medications?: Patient declined    Lack of Transportation (Non-Medical): Patient declined  Physical Activity: Not on file  Stress: No Stress Concern Present (12/17/2018)   Received from [facility] - Occupational Stress Questionnaire    Feeling of Stress : Not at all  Social Connections: Unknown (12/17/2018)   Received from [facility]   Social Connection and Isolation Panel    In a typical week, how many times do you talk on the phone with family, friends, or neighbors?: Patient declined    How  often do you get together with friends or relatives?: Patient declined    How often do you attend church or religious services?: Patient declined    Do you belong to any clubs or organizations such as church groups, unions, fraternal or athletic groups, or school groups?: Patient declined    How often do you attend meetings of the clubs or organizations you belong to?: Patient declined    Are you married, widowed, divorced, separated, never married, or living with a partner?: Patient declined  Intimate Partner Violence: Not on file    Review of Systems: See HPI, otherwise negative ROS  Physical Exam: BP (!) 150/108   Pulse 96   Temp (!) 96.8 F (36 C) (Temporal)   Resp 20   Ht 6' 2 (1.88 m)   Wt 99.3 kg   SpO2 96%   BMI 28.12 kg/m  General:   Alert,  pleasant and cooperative in NAD Head:  Normocephalic and atraumatic. Neck:  Supple; no masses or thyromegaly. Lungs:  Clear throughout to auscultation, normal respiratory effort.    Heart:  +S1, +S2, Regular rate and rhythm, No edema. Abdomen:  Soft, nontender and nondistended. Normal bowel sounds, without guarding, and without rebound.   Neurologic:  Alert and  oriented x4;  grossly normal neurologically.  Impression/Plan: Brent Cochran is here for an colonoscopy to be performed for Screening colonoscopy , mother had colon cancer.   Risks, benefits, limitations, and alternatives regarding  colonoscopy have been reviewed with the patient.  Questions have been answered.  All parties agreeable.   Ruel Kung, MD  01/14/2024, 7:41 AM

## 2024-01-15 LAB — SURGICAL PATHOLOGY
